# Patient Record
Sex: Female | Born: 1940
Health system: Southern US, Community
[De-identification: ages and names within clinical notes are randomized; demographics above are authoritative.]

## PROBLEM LIST (undated history)

## (undated) DIAGNOSIS — G4700 Insomnia, unspecified: Secondary | ICD-10-CM

## (undated) DIAGNOSIS — E010 Iodine-deficiency related diffuse (endemic) goiter: Secondary | ICD-10-CM

## (undated) DIAGNOSIS — F32A Depression, unspecified: Secondary | ICD-10-CM

## (undated) DIAGNOSIS — E039 Hypothyroidism, unspecified: Secondary | ICD-10-CM

## (undated) DIAGNOSIS — F419 Anxiety disorder, unspecified: Secondary | ICD-10-CM

## (undated) DIAGNOSIS — Z780 Asymptomatic menopausal state: Secondary | ICD-10-CM

## (undated) DIAGNOSIS — H6122 Impacted cerumen, left ear: Secondary | ICD-10-CM

## (undated) DIAGNOSIS — M810 Age-related osteoporosis without current pathological fracture: Secondary | ICD-10-CM

## (undated) DIAGNOSIS — F329 Major depressive disorder, single episode, unspecified: Secondary | ICD-10-CM

## (undated) DIAGNOSIS — I10 Essential (primary) hypertension: Secondary | ICD-10-CM

## (undated) DIAGNOSIS — E785 Hyperlipidemia, unspecified: Secondary | ICD-10-CM

## (undated) HISTORY — DX: Impacted cerumen, left ear: H61.22

## (undated) HISTORY — DX: Depression, unspecified: F32.A

## (undated) HISTORY — DX: Iodine-deficiency related diffuse (endemic) goiter: E01.0

## (undated) HISTORY — DX: Anxiety disorder, unspecified: F41.9

## (undated) HISTORY — DX: Major depressive disorder, single episode, unspecified: F32.9

## (undated) HISTORY — DX: Asymptomatic menopausal state: Z78.0

## (undated) HISTORY — DX: Hypothyroidism, unspecified: E03.9

## (undated) HISTORY — DX: Age-related osteoporosis without current pathological fracture: M81.0

## (undated) HISTORY — DX: Hyperlipidemia, unspecified: E78.5

## (undated) HISTORY — DX: Essential (primary) hypertension: I10

## (undated) HISTORY — PX: KNEE SURGERY: SHX244

## (undated) HISTORY — DX: Insomnia, unspecified: G47.00

---

## 1961-01-06 HISTORY — PX: OTHER SURGICAL HISTORY: SHX169

## 1966-01-06 HISTORY — PX: OTHER SURGICAL HISTORY: SHX169

## 1969-01-06 HISTORY — PX: OTHER SURGICAL HISTORY: SHX169

## 1975-01-07 HISTORY — PX: OTHER SURGICAL HISTORY: SHX169

## 1975-01-07 HISTORY — PX: TUBAL LIGATION: SHX77

## 1995-12-07 DIAGNOSIS — E039 Hypothyroidism, unspecified: Secondary | ICD-10-CM

## 1995-12-07 HISTORY — DX: Hypothyroidism, unspecified: E03.9

## 1997-01-06 DIAGNOSIS — Z780 Asymptomatic menopausal state: Secondary | ICD-10-CM

## 1997-01-06 HISTORY — DX: Asymptomatic menopausal state: Z78.0

## 1998-01-06 DIAGNOSIS — E010 Iodine-deficiency related diffuse (endemic) goiter: Secondary | ICD-10-CM

## 1998-01-06 HISTORY — DX: Iodine-deficiency related diffuse (endemic) goiter: E01.0

## 1998-06-27 ENCOUNTER — Other Ambulatory Visit: Admission: RE | Admit: 1998-06-27 | Discharge: 1998-06-27 | Payer: Self-pay | Admitting: Family Medicine

## 1999-09-04 ENCOUNTER — Other Ambulatory Visit: Admission: RE | Admit: 1999-09-04 | Discharge: 1999-09-04 | Payer: Self-pay | Admitting: Family Medicine

## 2000-09-09 ENCOUNTER — Other Ambulatory Visit: Admission: RE | Admit: 2000-09-09 | Discharge: 2000-09-09 | Payer: Self-pay | Admitting: Unknown Physician Specialty

## 2000-10-09 ENCOUNTER — Encounter: Payer: Self-pay | Admitting: Family Medicine

## 2000-10-09 ENCOUNTER — Encounter: Admission: RE | Admit: 2000-10-09 | Discharge: 2000-10-09 | Payer: Self-pay | Admitting: Family Medicine

## 2000-12-08 ENCOUNTER — Encounter: Payer: Self-pay | Admitting: Family Medicine

## 2000-12-08 ENCOUNTER — Encounter: Admission: RE | Admit: 2000-12-08 | Discharge: 2000-12-08 | Payer: Self-pay | Admitting: Family Medicine

## 2001-10-04 ENCOUNTER — Other Ambulatory Visit: Admission: RE | Admit: 2001-10-04 | Discharge: 2001-10-04 | Payer: Self-pay | Admitting: Family Medicine

## 2002-10-24 ENCOUNTER — Other Ambulatory Visit: Admission: RE | Admit: 2002-10-24 | Discharge: 2002-10-24 | Payer: Self-pay | Admitting: Family Medicine

## 2002-12-05 ENCOUNTER — Encounter: Admission: RE | Admit: 2002-12-05 | Discharge: 2002-12-05 | Payer: Self-pay | Admitting: Family Medicine

## 2003-11-14 ENCOUNTER — Other Ambulatory Visit: Admission: RE | Admit: 2003-11-14 | Discharge: 2003-11-14 | Payer: Self-pay | Admitting: Family Medicine

## 2003-12-18 ENCOUNTER — Encounter: Admission: RE | Admit: 2003-12-18 | Discharge: 2003-12-18 | Payer: Self-pay | Admitting: Family Medicine

## 2004-12-20 ENCOUNTER — Other Ambulatory Visit: Admission: RE | Admit: 2004-12-20 | Discharge: 2004-12-20 | Payer: Self-pay | Admitting: Family Medicine

## 2006-02-20 ENCOUNTER — Other Ambulatory Visit: Admission: RE | Admit: 2006-02-20 | Discharge: 2006-02-20 | Payer: Self-pay | Admitting: Family Medicine

## 2007-03-09 ENCOUNTER — Encounter: Admission: RE | Admit: 2007-03-09 | Discharge: 2007-03-09 | Payer: Self-pay | Admitting: Family Medicine

## 2008-03-30 ENCOUNTER — Encounter: Admission: RE | Admit: 2008-03-30 | Discharge: 2008-03-30 | Payer: Self-pay | Admitting: Family Medicine

## 2010-04-21 IMAGING — MG MM SCREEN MAMMOGRAM BILATERAL
1 series · 1 of 1 positions shown · non-contrast
Comparison: Prior studies.

DG SCREEN MAMMOGRAM BILATERAL
Bilateral CC and MLO view(s) were taken.

DIGITAL SCREENING MAMMOGRAM WITH CAD:

[R CC]
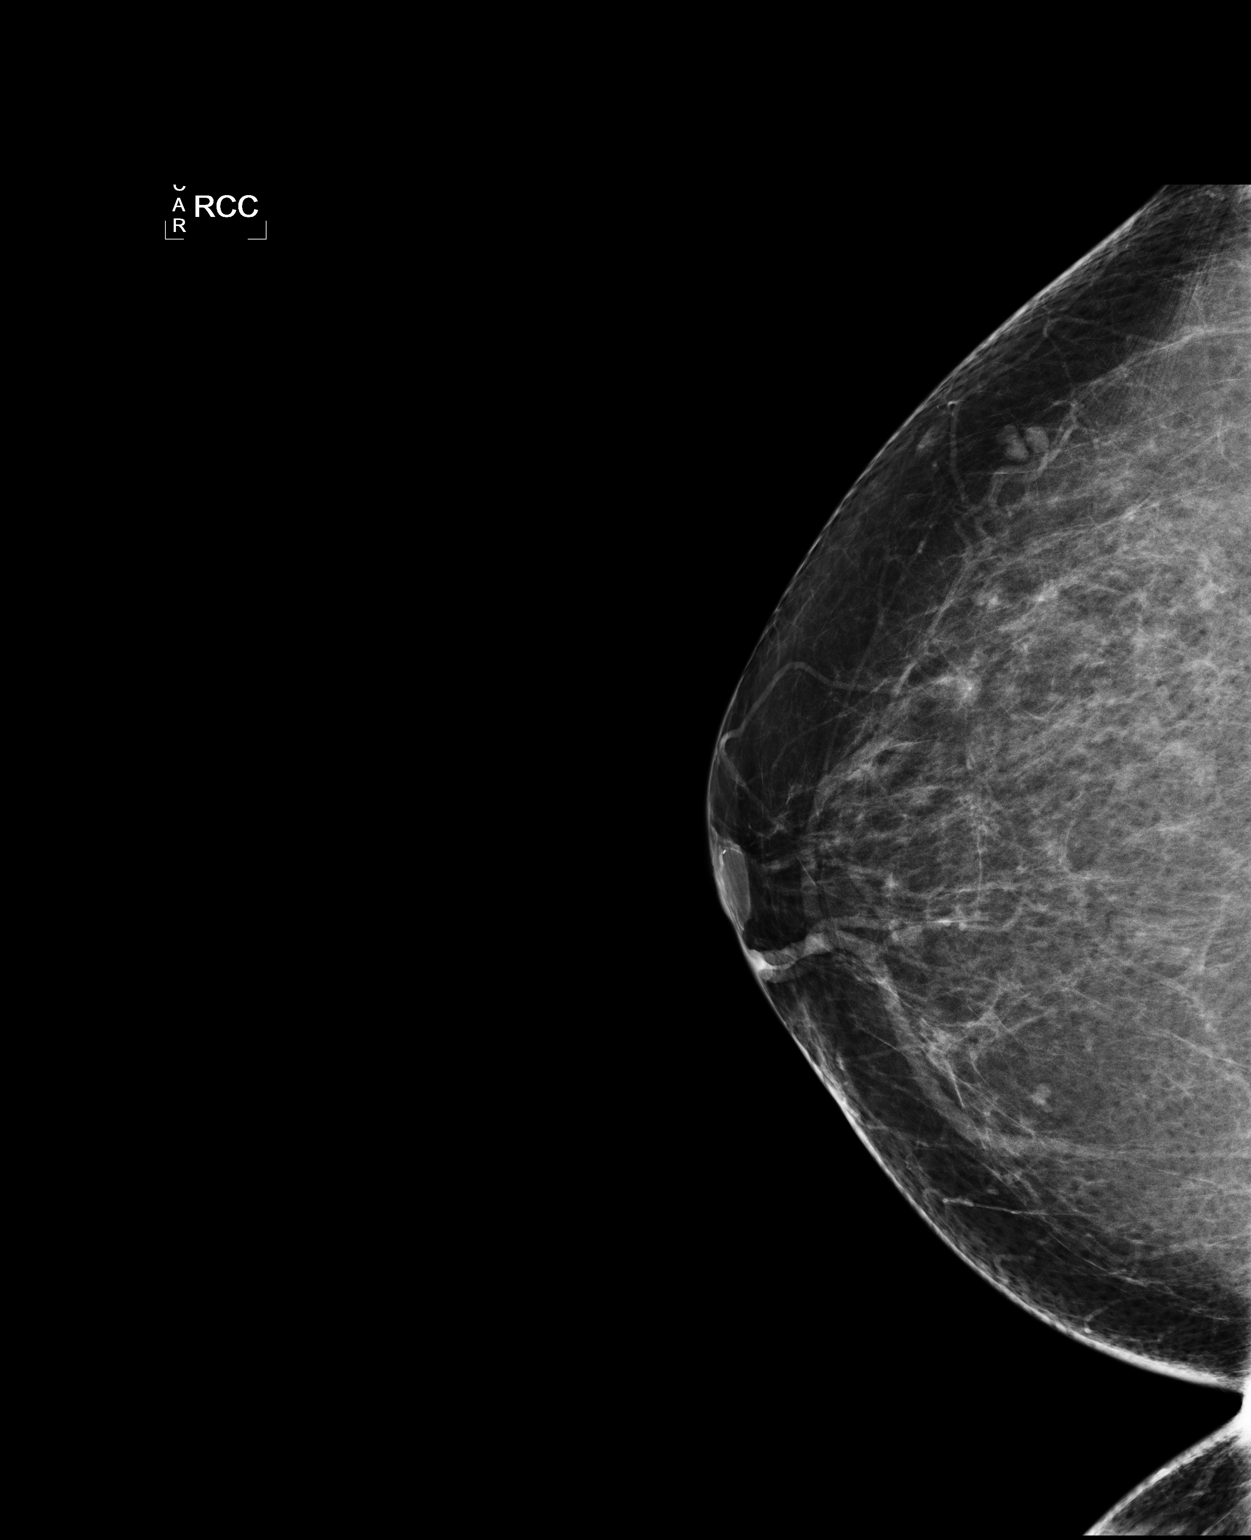

[1 of 1 positions shown; findings below may reference images not displayed]

There are scattered fibroglandular densities.  There is no dominant mass, architectural distortion 
or calcification to suggest malignancy.
IMPRESSION: No mammographic evidence of malignancy.  Suggest yearly screening mammography.

A result letter of this screening mammogram will be mailed directly to the patient.

ASSESSMENT: Negative - BI-RADS 1

Screening mammogram in 1 year.
ANALYZED BY COMPUTER AIDED DETECTION. , THIS PROCEDURE WAS A DIGITAL MAMMOGRAM.

## 2010-06-06 ENCOUNTER — Encounter: Payer: Self-pay | Admitting: Physician Assistant

## 2011-06-25 DIAGNOSIS — E039 Hypothyroidism, unspecified: Secondary | ICD-10-CM | POA: Diagnosis not present

## 2011-06-25 DIAGNOSIS — F411 Generalized anxiety disorder: Secondary | ICD-10-CM | POA: Diagnosis not present

## 2011-06-25 DIAGNOSIS — I10 Essential (primary) hypertension: Secondary | ICD-10-CM | POA: Diagnosis not present

## 2011-06-25 DIAGNOSIS — E785 Hyperlipidemia, unspecified: Secondary | ICD-10-CM | POA: Diagnosis not present

## 2011-06-25 DIAGNOSIS — R072 Precordial pain: Secondary | ICD-10-CM | POA: Diagnosis not present

## 2011-11-13 DIAGNOSIS — Z23 Encounter for immunization: Secondary | ICD-10-CM | POA: Diagnosis not present

## 2012-02-26 DIAGNOSIS — Z1231 Encounter for screening mammogram for malignant neoplasm of breast: Secondary | ICD-10-CM | POA: Diagnosis not present

## 2012-03-27 ENCOUNTER — Other Ambulatory Visit: Payer: Self-pay | Admitting: Nurse Practitioner

## 2012-04-04 ENCOUNTER — Other Ambulatory Visit: Payer: Self-pay | Admitting: Nurse Practitioner

## 2012-05-16 ENCOUNTER — Other Ambulatory Visit: Payer: Self-pay | Admitting: Nurse Practitioner

## 2012-05-17 NOTE — Telephone Encounter (Signed)
Patient last seen in office and had labs done on 06-25-11. Please advise

## 2012-05-17 NOTE — Telephone Encounter (Signed)
rx ready for pickup 

## 2012-05-18 NOTE — Telephone Encounter (Signed)
RX up front

## 2012-06-02 ENCOUNTER — Other Ambulatory Visit: Payer: Medicare Other

## 2012-06-02 ENCOUNTER — Other Ambulatory Visit: Payer: Self-pay | Admitting: Nurse Practitioner

## 2012-06-02 MED ORDER — SERTRALINE HCL 50 MG PO TABS: 50.0000 mg | ORAL_TABLET | Freq: Every day | ORAL | Status: DC

## 2012-06-02 MED ORDER — SYNTHROID 100 MCG PO TABS: 100.0000 ug | ORAL_TABLET | Freq: Every day | ORAL | Status: DC

## 2012-06-07 ENCOUNTER — Ambulatory Visit (INDEPENDENT_AMBULATORY_CARE_PROVIDER_SITE_OTHER): Payer: Medicare Other | Admitting: Nurse Practitioner

## 2012-06-07 ENCOUNTER — Encounter: Payer: Self-pay | Admitting: Nurse Practitioner

## 2012-06-07 VITALS — BP 144/74 | HR 76 | Temp 97.2°F | Ht 61.0 in | Wt 179.0 lb

## 2012-06-07 DIAGNOSIS — F329 Major depressive disorder, single episode, unspecified: Secondary | ICD-10-CM | POA: Diagnosis not present

## 2012-06-07 DIAGNOSIS — Z0000 Encounter for general adult medical examination without abnormal findings: Secondary | ICD-10-CM

## 2012-06-07 DIAGNOSIS — F32A Depression, unspecified: Secondary | ICD-10-CM

## 2012-06-07 DIAGNOSIS — M810 Age-related osteoporosis without current pathological fracture: Secondary | ICD-10-CM | POA: Insufficient documentation

## 2012-06-07 DIAGNOSIS — F411 Generalized anxiety disorder: Secondary | ICD-10-CM | POA: Insufficient documentation

## 2012-06-07 DIAGNOSIS — E785 Hyperlipidemia, unspecified: Secondary | ICD-10-CM | POA: Insufficient documentation

## 2012-06-07 DIAGNOSIS — E039 Hypothyroidism, unspecified: Secondary | ICD-10-CM | POA: Insufficient documentation

## 2012-06-07 LAB — COMPLETE METABOLIC PANEL WITH GFR
ALT: 29 U/L (ref 0–35)
Albumin: 4.6 g/dL (ref 3.5–5.2)
CO2: 30 mEq/L (ref 19–32)
Calcium: 10.3 mg/dL (ref 8.4–10.5)
Chloride: 104 mEq/L (ref 96–112)
GFR, Est African American: 89 mL/min
Sodium: 144 mEq/L (ref 135–145)
Total Protein: 7.4 g/dL (ref 6.0–8.3)

## 2012-06-07 LAB — THYROID PANEL WITH TSH
T4, Total: 8.2 ug/dL (ref 5.0–12.5)
TSH: 5.854 u[IU]/mL — ABNORMAL HIGH (ref 0.350–4.500)

## 2012-06-07 MED ORDER — SERTRALINE HCL 50 MG PO TABS: 50.0000 mg | ORAL_TABLET | Freq: Every day | ORAL | Status: DC

## 2012-06-07 NOTE — Patient Instructions (Signed)

## 2012-06-07 NOTE — Progress Notes (Addendum)
Subjective:    Patient ID: Sheila Ferrell, female    DOB: November 30, 1940, 72 y.o.   MRN: [id number] Patient here today for CPE- Hasn't had one in several years: Hyperlipidemia This is a chronic problem. The current episode started more than 1 year ago. The problem is controlled. Recent lipid tests were reviewed and are high. There are no known factors aggravating her hyperlipidemia. Pertinent negatives include no focal sensory loss, leg pain or myalgias. Current antihyperlipidemic treatment includes statins. The current treatment provides moderate improvement of lipids. Compliance problems include adherence to diet and adherence to exercise.   Thyroid Problem Presents for follow-up visit. Patient reports no anxiety, cold intolerance, depressed mood, diarrhea, dry skin, fatigue, hoarse voice, leg swelling, menstrual problem, nail problem, tremors or visual change. The symptoms have been stable. Her past medical history is significant for hyperlipidemia.  GAD Takes ativan about every other day- Keeps her from getting anxious- No side effects Depression Zoloft keeps her from getting upset- Husband has dementia and sh eis his sole caregiver. Osteorporosis Fosamax every Monday- no C/o side effects   Review of Systems  Constitutional: Negative for fatigue.  HENT: Negative for hoarse voice.   Gastrointestinal: Negative for diarrhea.  Endocrine: Negative for cold intolerance.  Genitourinary: Negative for menstrual problem.  Musculoskeletal: Negative for myalgias.  Neurological: Negative for tremors.  All other systems reviewed and are negative.       Objective:   Physical Exam  Constitutional: She is oriented to person, place, and time. She appears well-developed and well-nourished.  HENT:  Nose: Nose normal.  Mouth/Throat: Oropharynx is clear and moist.  Eyes: EOM are normal.  Neck: Trachea normal, normal range of motion and full passive range of motion without pain. Neck supple. No JVD  present. Carotid bruit is not present. No thyromegaly present.  Cardiovascular: Normal rate, regular rhythm, normal heart sounds and intact distal pulses.  Exam reveals no gallop and no friction rub.   No murmur heard. Pulmonary/Chest: Effort normal and breath sounds normal.  Abdominal: Soft. Bowel sounds are normal. She exhibits no distension and no mass. There is no tenderness.  Musculoskeletal: Normal range of motion.  Lymphadenopathy:    She has no cervical adenopathy.  Neurological: She is alert and oriented to person, place, and time. She has normal reflexes.  Skin: Skin is warm and dry.  Psychiatric: She has a normal mood and affect. Her behavior is normal. Judgment and thought content normal.    BP 144/74  Pulse 76  Temp(Src) 97.2 F (36.2 C) (Oral)  Ht 5\' 1"  (1.549 m)  Wt 179 lb (81.194 kg)  BMI 33.84 kg/m2       Assessment & Plan:  . 1. Annual physical exam   2. Hypothyroidism   3. GAD (generalized anxiety disorder)   4. Depression   5. Hyperlipidemia   6. Osteoporosis, unspecified    Orders Placed This Encounter  Procedures  . DG Bone Density    Standing Status: Future     Number of Occurrences:      Standing Expiration Date: 08/07/2013    Order Specific Question:  Reason for Exam (SYMPTOM  OR DIAGNOSIS REQUIRED)    Answer:  osteoporosis    Order Specific Question:  Preferred imaging location?    Answer:  Internal  . COMPLETE METABOLIC PANEL WITH GFR  . NMR Lipoprofile with Lipids  . Thyroid Panel With TSH   Current Outpatient Prescriptions on File Prior to Visit  Medication Sig Dispense Refill  .  alendronate (FOSAMAX) 70 MG tablet TAKE 1 TABLET EVERY WEEK  4 tablet  0  . LORazepam (ATIVAN) 0.5 MG tablet Take 0.5 mg by mouth 2 (two) times daily as needed.         No current facility-administered medications on file prior to visit.   Continue all meds  Labs pending Diet and exercise encouraged  Mary-Margaret Daphine Deutscher, FNP

## 2012-06-08 ENCOUNTER — Other Ambulatory Visit: Payer: Self-pay | Admitting: Nurse Practitioner

## 2012-06-08 LAB — NMR LIPOPROFILE WITH LIPIDS
HDL Particle Number: 33.5 umol/L (ref >=30.5)
LDL Size: 20.3 nm — ABNORMAL LOW (ref >20.5)
Large HDL-P: 5.4 umol/L (ref >=4.8)
Large VLDL-P: 5.1 nmol/L — ABNORMAL HIGH (ref <=2.7)
Small LDL Particle Number: 438 nmol/L (ref <=527)

## 2012-06-08 MED ORDER — SYNTHROID 112 MCG PO TABS: 112.0000 ug | ORAL_TABLET | Freq: Every day | ORAL | Status: DC

## 2012-06-08 NOTE — Progress Notes (Signed)
Change in meds

## 2012-06-09 NOTE — Addendum Note (Signed)
Addended by: Bennie Pierini on: 06/09/2012 11:37 AM   Modules accepted: Level of Service

## 2012-06-22 ENCOUNTER — Telehealth: Payer: Self-pay | Admitting: Nurse Practitioner

## 2012-06-22 MED ORDER — ALENDRONATE SODIUM 70 MG PO TABS: 70.0000 mg | ORAL_TABLET | ORAL | Status: DC

## 2012-06-22 MED ORDER — ATORVASTATIN CALCIUM 20 MG PO TABS: 20.0000 mg | ORAL_TABLET | Freq: Every day | ORAL | Status: DC

## 2012-06-22 NOTE — Telephone Encounter (Signed)
RX SENT IN.  

## 2012-06-22 NOTE — Telephone Encounter (Signed)
Patient notified

## 2012-07-03 ENCOUNTER — Other Ambulatory Visit: Payer: Self-pay | Admitting: Nurse Practitioner

## 2012-07-07 ENCOUNTER — Other Ambulatory Visit: Payer: Self-pay | Admitting: Nurse Practitioner

## 2012-07-30 ENCOUNTER — Other Ambulatory Visit: Payer: Self-pay | Admitting: *Deleted

## 2012-07-30 MED ORDER — ATORVASTATIN CALCIUM 20 MG PO TABS: 20.0000 mg | ORAL_TABLET | Freq: Every day | ORAL | Status: DC

## 2012-07-30 MED ORDER — SYNTHROID 112 MCG PO TABS: 112.0000 ug | ORAL_TABLET | Freq: Every day | ORAL | Status: DC

## 2012-07-30 MED ORDER — SERTRALINE HCL 50 MG PO TABS: ORAL_TABLET | ORAL | Status: DC

## 2012-07-30 MED ORDER — ALENDRONATE SODIUM 70 MG PO TABS: 70.0000 mg | ORAL_TABLET | ORAL | Status: DC

## 2012-08-04 ENCOUNTER — Encounter: Payer: Self-pay | Admitting: Pharmacist

## 2012-08-04 ENCOUNTER — Ambulatory Visit (INDEPENDENT_AMBULATORY_CARE_PROVIDER_SITE_OTHER): Payer: Medicare Other

## 2012-08-04 ENCOUNTER — Ambulatory Visit (INDEPENDENT_AMBULATORY_CARE_PROVIDER_SITE_OTHER): Payer: Medicare Other | Admitting: Pharmacist

## 2012-08-04 VITALS — BP 132/70 | HR 84 | Ht 61.0 in | Wt 180.0 lb

## 2012-08-04 DIAGNOSIS — E785 Hyperlipidemia, unspecified: Secondary | ICD-10-CM | POA: Diagnosis not present

## 2012-08-04 DIAGNOSIS — M810 Age-related osteoporosis without current pathological fracture: Secondary | ICD-10-CM

## 2012-08-04 NOTE — Patient Instructions (Signed)

## 2012-08-04 NOTE — Progress Notes (Signed)
Patient ID: Sheila Ferrell, female   DOB: Aug 11, 1940, 72 y.o.   MRN: [id number] [facility] Current Height: Height: 5\' 1"  (154.9 cm)      Max Lifetime Height:  5\' 2"  Current Weight: Weight: 180 lb (81.647 kg)       Ethnicity:Caucasian  BP: BP: 132/70 mmHg     HR:  Pulse Rate: 84      HPI: Does pt already have a diagnosis of:   Osteoporosis?  Yes  Back Pain?  No       Kyphosis?  No Prior fracture?  No Med(s) for Osteoporosis/Osteopenia:  Alendronate 70mg  weekly - for 9 years Med(s) previously tried for Osteoporosis/Osteopenia:  none                                                             PMH: Age at menopause:  Mid 37's  Hysterectomy?  No Oophorectomy?  No HRT? Yes - Former.  Type/duration: estrogen Steroid Use?  No Thyroid med?  Yes History of cancer?  No History of digestive disorders (ie Crohn's)?  No Current or previous eating disorders?  No Last Vitamin D Result:  43 (06/2010) Last GFR Result:  77 (06/2012)   FH/SH: Family history of osteoporosis?  No Parent with history of hip fracture?  Yes -mother Family history of breast cancer?  No Exercise?  No Smoking?  No Alcohol?  No    Calcium Assessment Calcium Intake  # of servings/day  Calcium mg  Milk (8 oz) 0  x  300  = 0  Yogurt (4 oz) 1 x  200 = 200mg   Cheese (1 oz) 1 x  200 = 200mg   Other Calcium sources   250mg   Ca supplement 0 = 0   Estimated calcium intake per day 650mg     DEXA Results Date of Test T-Score for AP Spine L1-L4 T-Score for Total Left Hip Neck of left hip T-Score for Total Right Hip  08/04/2012 -0.8 -1.0 -2.1 -0.9  08/22/2009 -0.5 -1.0 -1.8 -1.0  11/27/2003 -1.7 -1.5 -2.4 --  10/11/2001 -1.7 -1.8 -2.5 --   Assessment: Osteoporosis with slight decreases in BMD since last check H/o of vitamin D deficiency - currently corrected with vitamin D supplementation.   Hypertriglyceridemia - Tg elevated  Recommendations: 1.  Continue alendronate (FOSAMAX) 70mg  1 tablet weekly -  patient was taking alendronate with synthroid.  Advised to separate all medications from alendronate by at least 30 minutes. 2.  recommend calcium 1200mg  daily through supplementation or diet.  3.  recommend weight bearing exercise - 30 minutes at least 4 days per week.  - will help with osteoporosis and elevated Tg 4.  Counseled and educated about fall risk and prevention 5.  Discussed foods that increased Tg - patient to limit high sugar and CHO foods.  6. Vitamin D level ordered with next labs.  Recheck DEXA:  2 years  Time spent counseling patient:  30 minutes

## 2012-09-10 ENCOUNTER — Other Ambulatory Visit: Payer: Self-pay | Admitting: Nurse Practitioner

## 2012-10-06 ENCOUNTER — Ambulatory Visit (INDEPENDENT_AMBULATORY_CARE_PROVIDER_SITE_OTHER): Payer: Medicare Other | Admitting: Nurse Practitioner

## 2012-10-06 ENCOUNTER — Encounter: Payer: Self-pay | Admitting: Nurse Practitioner

## 2012-10-06 VITALS — BP 123/63 | HR 85 | Temp 98.4°F | Ht 61.0 in | Wt 182.0 lb

## 2012-10-06 DIAGNOSIS — E785 Hyperlipidemia, unspecified: Secondary | ICD-10-CM

## 2012-10-06 DIAGNOSIS — E559 Vitamin D deficiency, unspecified: Secondary | ICD-10-CM | POA: Diagnosis not present

## 2012-10-06 DIAGNOSIS — F329 Major depressive disorder, single episode, unspecified: Secondary | ICD-10-CM

## 2012-10-06 DIAGNOSIS — F411 Generalized anxiety disorder: Secondary | ICD-10-CM

## 2012-10-06 DIAGNOSIS — E039 Hypothyroidism, unspecified: Secondary | ICD-10-CM | POA: Diagnosis not present

## 2012-10-06 DIAGNOSIS — D239 Other benign neoplasm of skin, unspecified: Secondary | ICD-10-CM

## 2012-10-06 DIAGNOSIS — D229 Melanocytic nevi, unspecified: Secondary | ICD-10-CM

## 2012-10-06 DIAGNOSIS — F32A Depression, unspecified: Secondary | ICD-10-CM

## 2012-10-06 MED ORDER — LORAZEPAM 0.5 MG PO TABS: 0.5000 mg | ORAL_TABLET | Freq: Two times a day (BID) | ORAL | Status: DC | PRN

## 2012-10-06 MED ORDER — ATORVASTATIN CALCIUM 20 MG PO TABS: 20.0000 mg | ORAL_TABLET | Freq: Every day | ORAL | Status: DC

## 2012-10-06 MED ORDER — SYNTHROID 112 MCG PO TABS: 112.0000 ug | ORAL_TABLET | Freq: Every day | ORAL | Status: DC

## 2012-10-06 MED ORDER — SERTRALINE HCL 50 MG PO TABS: ORAL_TABLET | ORAL | Status: DC

## 2012-10-06 NOTE — Patient Instructions (Signed)

## 2012-10-06 NOTE — Progress Notes (Signed)
Subjective:    Patient ID: Sheila Ferrell, female    DOB: Dec 10, 1940, 72 y.o.   MRN: [id number]  Hyperlipidemia This is a chronic problem. The current episode started more than 1 year ago. The problem is controlled. Recent lipid tests were reviewed and are high. There are no known factors aggravating her hyperlipidemia. Pertinent negatives include no focal sensory loss, leg pain or myalgias. Current antihyperlipidemic treatment includes statins. The current treatment provides moderate improvement of lipids. Compliance problems include adherence to diet and adherence to exercise.   Thyroid Problem Presents for follow-up visit. Patient reports no anxiety, cold intolerance, depressed mood, diarrhea, dry skin, fatigue, hoarse voice, leg swelling, menstrual problem, nail problem, tremors or visual change. The symptoms have been stable. Her past medical history is significant for hyperlipidemia.  GAD Takes ativan about every other day- Keeps her from getting anxious- No side effects Depression Zoloft keeps her from getting upset- Husband has dementia and sh eis his sole caregiver. Osteorporosis Fosamax every Monday- no C/o side effects MOLES Multiple- patient wants to see dermatologist  Review of Systems  Constitutional: Negative for fatigue.  HENT: Negative for hoarse voice.   Gastrointestinal: Negative for diarrhea.  Endocrine: Negative for cold intolerance.  Genitourinary: Negative for menstrual problem.  Musculoskeletal: Negative for myalgias.  Neurological: Negative for tremors.  All other systems reviewed and are negative.       Objective:   Physical Exam  Constitutional: She is oriented to person, place, and time. She appears well-developed and well-nourished.  HENT:  Nose: Nose normal.  Mouth/Throat: Oropharynx is clear and moist.  Eyes: EOM are normal.  Neck: Trachea normal, normal range of motion and full passive range of motion without pain. Neck supple. No JVD present.  Carotid bruit is not present. No thyromegaly present.  Cardiovascular: Normal rate, regular rhythm, normal heart sounds and intact distal pulses.  Exam reveals no gallop and no friction rub.   No murmur heard. Pulmonary/Chest: Effort normal and breath sounds normal.  Abdominal: Soft. Bowel sounds are normal. She exhibits no distension and no mass. There is no tenderness.  Musculoskeletal: Normal range of motion.  Lymphadenopathy:    She has no cervical adenopathy.  Neurological: She is alert and oriented to person, place, and time. She has normal reflexes.  Skin: Skin is warm and dry.  Psychiatric: She has a normal mood and affect. Her behavior is normal. Judgment and thought content normal.    BP 123/63  Pulse 85  Temp(Src) 98.4 F (36.9 C) (Oral)  Ht 5\' 1"  (1.549 m)  Wt 182 lb (82.555 kg)  BMI 34.41 kg/m2       Assessment & Plan:   1. Hypothyroidism   2. Hyperlipidemia   3. GAD (generalized anxiety disorder)   4. Depression   5. Skin moles   6. Unspecified vitamin D deficiency   7. Vitamin D deficiency    Orders Placed This Encounter  Procedures  . CMP14+EGFR  . NMR, lipoprofile  . Thyroid Panel With TSH  . Vit D  25 hydroxy (rtn osteoporosis monitoring)  . Ambulatory referral to Dermatology    Referral Priority:  Routine    Referral Type:  Consultation    Referral Reason:  Specialty Services Required    Requested Specialty:  Dermatology    Number of Visits Requested:  1   Meds ordered this encounter  Medications  . sertraline (ZOLOFT) 50 MG tablet    Sig: TAKE ONE TABLET BY MOUTH ONE TIME DAILY  Dispense:  90 tablet    Refill:  1    Order Specific Question:  Supervising Provider    Answer:  Ernestina Penna [[id number]]  . atorvastatin (LIPITOR) 20 MG tablet    Sig: Take 1 tablet (20 mg total) by mouth daily.    Dispense:  90 tablet    Refill:  1    Order Specific Question:  Supervising Provider    Answer:  Ernestina Penna [[id number]]  . SYNTHROID 112 MCG tablet     Sig: Take 1 tablet (112 mcg total) by mouth daily before breakfast.    Dispense:  90 tablet    Refill:  1    Order Specific Question:  Supervising Provider    Answer:  Ernestina Penna [[id number]]  . LORazepam (ATIVAN) 0.5 MG tablet    Sig: Take 1 tablet (0.5 mg total) by mouth 2 (two) times daily as needed.    Dispense:  30 tablet    Refill:  2    Order Specific Question:  Supervising Provider    Answer:  Deborra Medina    Continue all meds Labs pending Diet and exercise encouraged Health maintenance reviewed Follow up in 3 months  Mary-Margaret Daphine Deutscher, FNP

## 2012-10-07 LAB — NMR, LIPOPROFILE
Cholesterol: 135 mg/dL (ref <200)
LDL Particle Number: 841 nmol/L (ref <1000)
LDLC SERPL CALC-MCNC: 56 mg/dL (ref <100)
LP-IR Score: 79 — ABNORMAL HIGH (ref <=45)
Triglycerides by NMR: 164 mg/dL — ABNORMAL HIGH (ref <150)

## 2012-10-07 LAB — THYROID PANEL WITH TSH
Free Thyroxine Index: 2.6 (ref 1.2–4.9)
T3 Uptake Ratio: 30 % (ref 24–39)
T4, Total: 8.5 ug/dL (ref 4.5–12.0)

## 2012-10-07 LAB — CMP14+EGFR
Albumin: 4.6 g/dL (ref 3.5–4.8)
Alkaline Phosphatase: 113 IU/L (ref 39–117)
BUN: 7 mg/dL — ABNORMAL LOW (ref 8–27)
CO2: 30 mmol/L — ABNORMAL HIGH (ref 18–29)
Chloride: 100 mmol/L (ref 97–108)
Glucose: 89 mg/dL (ref 65–99)
Total Protein: 6.9 g/dL (ref 6.0–8.5)

## 2012-10-20 ENCOUNTER — Telehealth: Payer: Self-pay | Admitting: Nurse Practitioner

## 2012-10-21 NOTE — Telephone Encounter (Signed)
It is ativan rx that is here to be picked up

## 2012-10-21 NOTE — Telephone Encounter (Signed)
Labs results are all normal

## 2012-10-22 NOTE — Telephone Encounter (Signed)
PAtient  Aware

## 2012-10-22 NOTE — Telephone Encounter (Signed)
Patient wants her derm referral in [place] not [place]

## 2012-10-22 NOTE — Telephone Encounter (Signed)
Patient aware.

## 2012-11-02 DIAGNOSIS — Z23 Encounter for immunization: Secondary | ICD-10-CM | POA: Diagnosis not present

## 2012-12-06 ENCOUNTER — Other Ambulatory Visit: Payer: Self-pay | Admitting: Nurse Practitioner

## 2012-12-12 ENCOUNTER — Other Ambulatory Visit: Payer: Self-pay | Admitting: Nurse Practitioner

## 2012-12-20 ENCOUNTER — Other Ambulatory Visit: Payer: Self-pay | Admitting: Nurse Practitioner

## 2012-12-27 ENCOUNTER — Other Ambulatory Visit: Payer: Self-pay | Admitting: Nurse Practitioner

## 2012-12-28 NOTE — Telephone Encounter (Signed)
Last seen 10/06/12  MMM  Pharmacy requesting 90 day supply

## 2013-01-10 ENCOUNTER — Ambulatory Visit (INDEPENDENT_AMBULATORY_CARE_PROVIDER_SITE_OTHER): Payer: Medicare Other | Admitting: Nurse Practitioner

## 2013-01-10 ENCOUNTER — Encounter: Payer: Self-pay | Admitting: Nurse Practitioner

## 2013-01-10 VITALS — BP 136/87 | HR 80 | Temp 96.3°F | Ht 61.0 in | Wt 182.0 lb

## 2013-01-10 DIAGNOSIS — F329 Major depressive disorder, single episode, unspecified: Secondary | ICD-10-CM

## 2013-01-10 DIAGNOSIS — E559 Vitamin D deficiency, unspecified: Secondary | ICD-10-CM

## 2013-01-10 DIAGNOSIS — E039 Hypothyroidism, unspecified: Secondary | ICD-10-CM

## 2013-01-10 DIAGNOSIS — Z23 Encounter for immunization: Secondary | ICD-10-CM | POA: Diagnosis not present

## 2013-01-10 DIAGNOSIS — E785 Hyperlipidemia, unspecified: Secondary | ICD-10-CM | POA: Diagnosis not present

## 2013-01-10 DIAGNOSIS — F411 Generalized anxiety disorder: Secondary | ICD-10-CM

## 2013-01-10 DIAGNOSIS — L989 Disorder of the skin and subcutaneous tissue, unspecified: Secondary | ICD-10-CM

## 2013-01-10 DIAGNOSIS — F32A Depression, unspecified: Secondary | ICD-10-CM

## 2013-01-10 DIAGNOSIS — F3289 Other specified depressive episodes: Secondary | ICD-10-CM

## 2013-01-10 MED ORDER — ALENDRONATE SODIUM 70 MG PO TABS: 70.0000 mg | ORAL_TABLET | ORAL | Status: DC

## 2013-01-10 MED ORDER — SERTRALINE HCL 50 MG PO TABS: ORAL_TABLET | ORAL | Status: DC

## 2013-01-10 MED ORDER — ATORVASTATIN CALCIUM 20 MG PO TABS: 20.0000 mg | ORAL_TABLET | Freq: Every day | ORAL | Status: DC

## 2013-01-10 MED ORDER — LORAZEPAM 0.5 MG PO TABS: 0.5000 mg | ORAL_TABLET | Freq: Two times a day (BID) | ORAL | Status: DC | PRN

## 2013-01-10 NOTE — Progress Notes (Signed)
Subjective:    Patient ID: Sheila Ferrell, female    DOB: November 17, 1940, 73 y.o.   MRN: [id number]  Patient here today fro follow up- no changes since last visit.  Hyperlipidemia This is a chronic problem. The current episode started more than 1 year ago. The problem is controlled. Recent lipid tests were reviewed and are high. There are no known factors aggravating her hyperlipidemia. Pertinent negatives include no focal sensory loss, leg pain or myalgias. Current antihyperlipidemic treatment includes statins. The current treatment provides moderate improvement of lipids. Compliance problems include adherence to diet and adherence to exercise.   Thyroid Problem Presents for follow-up visit. Patient reports no anxiety, cold intolerance, depressed mood, diarrhea, dry skin, fatigue, hoarse voice, leg swelling, menstrual problem, nail problem, tremors or visual change. The symptoms have been stable. Her past medical history is significant for hyperlipidemia.  GAD Takes ativan about every other day- Keeps her from getting anxious- No side effects Depression Zoloft keeps her from getting upset- Husband has dementia and sh eis his sole caregiver. Osteorporosis Fosamax every Monday- no C/o side effects MOLES Multiple- patient wants to see dermatologist  Review of Systems  Constitutional: Negative for fatigue.  HENT: Negative for hoarse voice.   Gastrointestinal: Negative for diarrhea.  Endocrine: Negative for cold intolerance.  Genitourinary: Negative for menstrual problem.  Musculoskeletal: Negative for myalgias.  Neurological: Negative for tremors.  All other systems reviewed and are negative.       Objective:   Physical Exam  Constitutional: She is oriented to person, place, and time. She appears well-developed and well-nourished.  HENT:  Nose: Nose normal.  Mouth/Throat: Oropharynx is clear and moist.  Eyes: EOM are normal.  Neck: Trachea normal, normal range of motion and full  passive range of motion without pain. Neck supple. No JVD present. Carotid bruit is not present. No thyromegaly present.  Cardiovascular: Normal rate, regular rhythm, normal heart sounds and intact distal pulses.  Exam reveals no gallop and no friction rub.   No murmur heard. Pulmonary/Chest: Effort normal and breath sounds normal.  Abdominal: Soft. Bowel sounds are normal. She exhibits no distension and no mass. There is no tenderness.  Musculoskeletal: Normal range of motion.  Lymphadenopathy:    She has no cervical adenopathy.  Neurological: She is alert and oriented to person, place, and time. She has normal reflexes.  Skin: Skin is warm and dry.  Psychiatric: She has a normal mood and affect. Her behavior is normal. Judgment and thought content normal.    BP 136/87  Pulse 80  Temp(Src) 96.3 F (35.7 C) (Oral)  Ht '5\' 1"'  (1.549 m)  Wt 182 lb (82.555 kg)  BMI 34.41 kg/m2       Assessment & Plan:   1. Vitamin D deficiency   2. Hypothyroidism   3. Hyperlipidemia   4. GAD (generalized anxiety disorder)   5. Depression   6. Benign skin lesion of multiple sites    Orders Placed This Encounter  Procedures  . CMP14+EGFR  . NMR, lipoprofile  . Thyroid Panel With TSH  . Ambulatory referral to Dermatology    Referral Priority:  Routine    Referral Type:  Consultation    Referral Reason:  Specialty Services Required    Referred to Provider:  Jerene Canny, MD    Requested Specialty:  Dermatology    Number of Visits Requested:  1   Meds ordered this encounter  Medications  . alendronate (FOSAMAX) 70 MG tablet    Sig:  Take 1 tablet (70 mg total) by mouth once a week. Take with a full glass of water on an empty stomach.    Dispense:  12 tablet    Refill:  1    Order Specific Question:  Supervising Provider    Answer:  Chipper Herb [[id number]]  . atorvastatin (LIPITOR) 20 MG tablet    Sig: Take 1 tablet (20 mg total) by mouth daily.    Dispense:  90 tablet    Refill:  1     Order Specific Question:  Supervising Provider    Answer:  Chipper Herb [[id number]]  . LORazepam (ATIVAN) 0.5 MG tablet    Sig: Take 1 tablet (0.5 mg total) by mouth 2 (two) times daily as needed.    Dispense:  30 tablet    Refill:  2    Order Specific Question:  Supervising Provider    Answer:  Chipper Herb [[id number]]  . sertraline (ZOLOFT) 50 MG tablet    Sig: TAKE ONE TABLET BY MOUTH ONE TIME DAILY    Dispense:  90 tablet    Refill:  1    Order Specific Question:  Supervising Provider    Answer:  Joycelyn Man    Continue all meds Labs pending Diet and exercise encouraged Health maintenance reviewed Follow up in 3 mmonths   Mary-Margaret Hassell Done, FNP

## 2013-01-10 NOTE — Patient Instructions (Signed)

## 2013-01-11 LAB — CMP14+EGFR
ALBUMIN: 4.3 g/dL (ref 3.5–4.8)
ALK PHOS: 110 IU/L (ref 39–117)
ALT: 24 IU/L (ref 0–32)
AST: 20 IU/L (ref 0–40)
Albumin/Globulin Ratio: 1.8 (ref 1.1–2.5)
BUN / CREAT RATIO: 14 (ref 11–26)
BUN: 9 mg/dL (ref 8–27)
CHLORIDE: 101 mmol/L (ref 97–108)
CO2: 28 mmol/L (ref 18–29)
CREATININE: 0.66 mg/dL (ref 0.57–1.00)
Calcium: 9.9 mg/dL (ref 8.6–10.2)
GFR calc Af Amer: 102 mL/min/{1.73_m2} (ref >59)
GFR calc non Af Amer: 89 mL/min/{1.73_m2} (ref >59)
Globulin, Total: 2.4 g/dL (ref 1.5–4.5)
Glucose: 94 mg/dL (ref 65–99)
Potassium: 4.2 mmol/L (ref 3.5–5.2)
Sodium: 144 mmol/L (ref 134–144)
Total Bilirubin: 0.3 mg/dL (ref 0.0–1.2)
Total Protein: 6.7 g/dL (ref 6.0–8.5)

## 2013-01-11 LAB — THYROID PANEL WITH TSH
Free Thyroxine Index: 2.3 (ref 1.2–4.9)
T3 UPTAKE RATIO: 30 % (ref 24–39)
T4 TOTAL: 7.7 ug/dL (ref 4.5–12.0)
TSH: 4.56 u[IU]/mL — ABNORMAL HIGH (ref 0.450–4.500)

## 2013-01-11 LAB — NMR, LIPOPROFILE
Cholesterol: 111 mg/dL (ref <200)
HDL Cholesterol by NMR: 39 mg/dL — ABNORMAL LOW (ref >=40)
HDL Particle Number: 30.4 umol/L — ABNORMAL LOW (ref >=30.5)
LDL PARTICLE NUMBER: 761 nmol/L (ref <1000)
LDL SIZE: 20 nm — AB (ref >20.5)
LDLC SERPL CALC-MCNC: 46 mg/dL (ref <100)
LP-IR Score: 64 — ABNORMAL HIGH (ref <=45)
Small LDL Particle Number: 683 nmol/L — ABNORMAL HIGH (ref <=527)
TRIGLYCERIDES BY NMR: 131 mg/dL (ref <150)

## 2013-02-03 DIAGNOSIS — D1801 Hemangioma of skin and subcutaneous tissue: Secondary | ICD-10-CM | POA: Diagnosis not present

## 2013-02-03 DIAGNOSIS — L821 Other seborrheic keratosis: Secondary | ICD-10-CM | POA: Diagnosis not present

## 2013-03-02 DIAGNOSIS — H5200 Hypermetropia, unspecified eye: Secondary | ICD-10-CM | POA: Diagnosis not present

## 2013-03-02 DIAGNOSIS — H52229 Regular astigmatism, unspecified eye: Secondary | ICD-10-CM | POA: Diagnosis not present

## 2013-03-02 DIAGNOSIS — H524 Presbyopia: Secondary | ICD-10-CM | POA: Diagnosis not present

## 2013-03-02 DIAGNOSIS — H2510 Age-related nuclear cataract, unspecified eye: Secondary | ICD-10-CM | POA: Diagnosis not present

## 2013-04-11 ENCOUNTER — Ambulatory Visit (INDEPENDENT_AMBULATORY_CARE_PROVIDER_SITE_OTHER): Payer: Medicare Other | Admitting: Nurse Practitioner

## 2013-04-11 ENCOUNTER — Encounter: Payer: Self-pay | Admitting: Nurse Practitioner

## 2013-04-11 VITALS — BP 140/81 | HR 89 | Temp 97.3°F | Ht 61.0 in | Wt 180.0 lb

## 2013-04-11 DIAGNOSIS — F329 Major depressive disorder, single episode, unspecified: Secondary | ICD-10-CM

## 2013-04-11 DIAGNOSIS — E559 Vitamin D deficiency, unspecified: Secondary | ICD-10-CM

## 2013-04-11 DIAGNOSIS — M79609 Pain in unspecified limb: Secondary | ICD-10-CM

## 2013-04-11 DIAGNOSIS — M79601 Pain in right arm: Secondary | ICD-10-CM

## 2013-04-11 DIAGNOSIS — F3289 Other specified depressive episodes: Secondary | ICD-10-CM

## 2013-04-11 DIAGNOSIS — E039 Hypothyroidism, unspecified: Secondary | ICD-10-CM | POA: Diagnosis not present

## 2013-04-11 DIAGNOSIS — F32A Depression, unspecified: Secondary | ICD-10-CM

## 2013-04-11 DIAGNOSIS — M810 Age-related osteoporosis without current pathological fracture: Secondary | ICD-10-CM

## 2013-04-11 DIAGNOSIS — F411 Generalized anxiety disorder: Secondary | ICD-10-CM

## 2013-04-11 DIAGNOSIS — Z23 Encounter for immunization: Secondary | ICD-10-CM | POA: Diagnosis not present

## 2013-04-11 DIAGNOSIS — E785 Hyperlipidemia, unspecified: Secondary | ICD-10-CM

## 2013-04-11 DIAGNOSIS — Z713 Dietary counseling and surveillance: Secondary | ICD-10-CM

## 2013-04-11 MED ORDER — SERTRALINE HCL 50 MG PO TABS: ORAL_TABLET | ORAL | Status: DC

## 2013-04-11 MED ORDER — SYNTHROID 112 MCG PO TABS: 112.0000 ug | ORAL_TABLET | Freq: Every day | ORAL | Status: DC

## 2013-04-11 MED ORDER — ATORVASTATIN CALCIUM 20 MG PO TABS: 20.0000 mg | ORAL_TABLET | Freq: Every day | ORAL | Status: DC

## 2013-04-11 MED ORDER — ALENDRONATE SODIUM 70 MG PO TABS: 70.0000 mg | ORAL_TABLET | ORAL | Status: DC

## 2013-04-11 MED ORDER — LORAZEPAM 0.5 MG PO TABS: 0.5000 mg | ORAL_TABLET | Freq: Two times a day (BID) | ORAL | Status: DC | PRN

## 2013-04-11 NOTE — Patient Instructions (Signed)

## 2013-04-11 NOTE — Progress Notes (Signed)
Subjective:    Patient ID: Sheila Ferrell, female    DOB: 16-Feb-1940, 73 y.o.   MRN: [id number]  Patient here today fro follow up- no changes since last visit.  Hyperlipidemia This is a chronic problem. The current episode started more than 1 year ago. The problem is controlled. Recent lipid tests were reviewed and are high. There are no known factors aggravating her hyperlipidemia. Pertinent negatives include no focal sensory loss, leg pain or myalgias. Current antihyperlipidemic treatment includes statins. The current treatment provides moderate improvement of lipids. Compliance problems include adherence to diet and adherence to exercise.   Thyroid Problem Presents for follow-up visit. Patient reports no anxiety, cold intolerance, depressed mood, diarrhea, dry skin, fatigue, hoarse voice, leg swelling, menstrual problem, nail problem, tremors or visual change. The symptoms have been stable. Her past medical history is significant for hyperlipidemia.  GAD Takes ativan about every other day- Keeps her from getting anxious- No side effects Depression Zoloft keeps her from getting upset- Husband has dementia and sh eis his sole caregiver. Osteorporosis Fosamax every Monday- no C/o side effects  * C/o right arm pain with certain movement - not daily-   Review of Systems  Constitutional: Negative for fatigue.  HENT: Negative for hoarse voice.   Gastrointestinal: Negative for diarrhea.  Endocrine: Negative for cold intolerance.  Genitourinary: Negative for menstrual problem.  Musculoskeletal: Negative for myalgias.  Neurological: Negative for tremors.  All other systems reviewed and are negative.       Objective:   Physical Exam  Constitutional: She is oriented to person, place, and time. She appears well-developed and well-nourished.  HENT:  Nose: Nose normal.  Mouth/Throat: Oropharynx is clear and moist.  Eyes: EOM are normal.  Neck: Trachea normal, normal range of motion and  full passive range of motion without pain. Neck supple. No JVD present. Carotid bruit is not present. No thyromegaly present.  Cardiovascular: Normal rate, regular rhythm, normal heart sounds and intact distal pulses.  Exam reveals no gallop and no friction rub.   No murmur heard. Pulmonary/Chest: Effort normal and breath sounds normal.  Abdominal: Soft. Bowel sounds are normal. She exhibits no distension and no mass. There is no tenderness.  Musculoskeletal: Normal range of motion.  FROM of right shoulder- no point tenderness- Grips equal bilaterally.  Lymphadenopathy:    She has no cervical adenopathy.  Neurological: She is alert and oriented to person, place, and time. She has normal reflexes.  Skin: Skin is warm and dry.  Psychiatric: She has a normal mood and affect. Her behavior is normal. Judgment and thought content normal.    BP 140/81  Pulse 89  Temp(Src) 97.3 F (36.3 C) (Oral)  Ht '5\' 1"'  (1.549 m)  Wt 180 lb (81.647 kg)  BMI 34.03 kg/m2       Assessment & Plan:    1. Vitamin D deficiency   2. Hypothyroidism   3. Hyperlipidemia   4. GAD (generalized anxiety disorder)   5. Depression   6. Weight loss counseling, encounter for   7. Osteoporosis   8. Right arm pain    Orders Placed This Encounter  Procedures  . CMP14+EGFR  . NMR, lipoprofile  . Thyroid Panel With TSH   Meds ordered this encounter  Medications  . alendronate (FOSAMAX) 70 MG tablet    Sig: Take 1 tablet (70 mg total) by mouth once a week. Take with a full glass of water on an empty stomach.    Dispense:  12 tablet  Refill:  1    Order Specific Question:  Supervising Provider    Answer:  Chipper Herb [[id number]]  . atorvastatin (LIPITOR) 20 MG tablet    Sig: Take 1 tablet (20 mg total) by mouth daily.    Dispense:  90 tablet    Refill:  1    Order Specific Question:  Supervising Provider    Answer:  Chipper Herb [[id number]]  . sertraline (ZOLOFT) 50 MG tablet    Sig: TAKE ONE TABLET BY  MOUTH ONE TIME DAILY    Dispense:  90 tablet    Refill:  1    Order Specific Question:  Supervising Provider    Answer:  Chipper Herb [[id number]]  . LORazepam (ATIVAN) 0.5 MG tablet    Sig: Take 1 tablet (0.5 mg total) by mouth 2 (two) times daily as needed.    Dispense:  30 tablet    Refill:  2    Order Specific Question:  Supervising Provider    Answer:  Chipper Herb [[id number]]  . SYNTHROID 112 MCG tablet    Sig: Take 1 tablet (112 mcg total) by mouth daily before breakfast.    Dispense:  90 tablet    Refill:  1    Order Specific Question:  Supervising Provider    Answer:  Chipper Herb [[id number]]   Patient to schedule mammogram and pap boostrix today Labs pending Health maintenance reviewed Diet and exercise encouraged- discussed weight loss Continue all meds Follow up  In 3 months   [place], FNP

## 2013-04-13 LAB — CMP14+EGFR
ALK PHOS: 108 IU/L (ref 39–117)
ALT: 21 IU/L (ref 0–32)
AST: 17 IU/L (ref 0–40)
Albumin/Globulin Ratio: 1.7 (ref 1.1–2.5)
Albumin: 4.5 g/dL (ref 3.5–4.8)
BUN / CREAT RATIO: 15 (ref 11–26)
BUN: 12 mg/dL (ref 8–27)
CHLORIDE: 100 mmol/L (ref 97–108)
CO2: 27 mmol/L (ref 18–29)
Calcium: 9.8 mg/dL (ref 8.7–10.3)
Creatinine, Ser: 0.82 mg/dL (ref 0.57–1.00)
GFR calc Af Amer: 83 mL/min/{1.73_m2} (ref >59)
GFR calc non Af Amer: 72 mL/min/{1.73_m2} (ref >59)
Globulin, Total: 2.6 g/dL (ref 1.5–4.5)
Glucose: 95 mg/dL (ref 65–99)
Potassium: 4.4 mmol/L (ref 3.5–5.2)
Sodium: 143 mmol/L (ref 134–144)
Total Bilirubin: 0.4 mg/dL (ref 0.0–1.2)
Total Protein: 7.1 g/dL (ref 6.0–8.5)

## 2013-04-13 LAB — NMR, LIPOPROFILE
Cholesterol: 130 mg/dL (ref <200)
HDL Cholesterol by NMR: 47 mg/dL (ref >=40)
HDL Particle Number: 36.1 umol/L (ref >=30.5)
LDL Particle Number: 846 nmol/L (ref <1000)
LDL Size: 20.9 nm (ref >20.5)
LDLC SERPL CALC-MCNC: 52 mg/dL (ref <100)
LP-IR Score: 71 — ABNORMAL HIGH (ref <=45)
Small LDL Particle Number: 422 nmol/L (ref <=527)
Triglycerides by NMR: 156 mg/dL — ABNORMAL HIGH (ref <150)

## 2013-04-13 LAB — THYROID PANEL WITH TSH
FREE THYROXINE INDEX: 2.9 (ref 1.2–4.9)
T3 Uptake Ratio: 31 % (ref 24–39)
T4, Total: 9.2 ug/dL (ref 4.5–12.0)
TSH: 0.651 u[IU]/mL (ref 0.450–4.500)

## 2013-05-23 DIAGNOSIS — H25813 Combined forms of age-related cataract, bilateral: Secondary | ICD-10-CM | POA: Insufficient documentation

## 2013-05-23 DIAGNOSIS — H2589 Other age-related cataract: Secondary | ICD-10-CM | POA: Diagnosis not present

## 2013-06-20 DIAGNOSIS — Z961 Presence of intraocular lens: Secondary | ICD-10-CM | POA: Insufficient documentation

## 2013-06-20 DIAGNOSIS — Z9849 Cataract extraction status, unspecified eye: Secondary | ICD-10-CM | POA: Insufficient documentation

## 2013-06-21 DIAGNOSIS — E039 Hypothyroidism, unspecified: Secondary | ICD-10-CM | POA: Diagnosis not present

## 2013-06-21 DIAGNOSIS — F430 Acute stress reaction: Secondary | ICD-10-CM | POA: Diagnosis not present

## 2013-06-21 DIAGNOSIS — IMO0002 Reserved for concepts with insufficient information to code with codable children: Secondary | ICD-10-CM | POA: Diagnosis not present

## 2013-06-21 DIAGNOSIS — E7800 Pure hypercholesterolemia, unspecified: Secondary | ICD-10-CM | POA: Diagnosis not present

## 2013-06-21 DIAGNOSIS — M810 Age-related osteoporosis without current pathological fracture: Secondary | ICD-10-CM | POA: Diagnosis not present

## 2013-06-21 DIAGNOSIS — Z9889 Other specified postprocedural states: Secondary | ICD-10-CM | POA: Diagnosis not present

## 2013-06-21 DIAGNOSIS — Z87891 Personal history of nicotine dependence: Secondary | ICD-10-CM | POA: Diagnosis not present

## 2013-06-21 DIAGNOSIS — H2589 Other age-related cataract: Secondary | ICD-10-CM | POA: Diagnosis not present

## 2013-06-22 DIAGNOSIS — Z961 Presence of intraocular lens: Secondary | ICD-10-CM | POA: Diagnosis not present

## 2013-06-22 DIAGNOSIS — Z48810 Encounter for surgical aftercare following surgery on the sense organs: Secondary | ICD-10-CM | POA: Diagnosis not present

## 2013-06-22 DIAGNOSIS — Z9849 Cataract extraction status, unspecified eye: Secondary | ICD-10-CM | POA: Diagnosis not present

## 2013-07-11 ENCOUNTER — Ambulatory Visit (INDEPENDENT_AMBULATORY_CARE_PROVIDER_SITE_OTHER): Payer: Medicare Other | Admitting: Nurse Practitioner

## 2013-07-11 ENCOUNTER — Encounter: Payer: Self-pay | Admitting: Nurse Practitioner

## 2013-07-11 VITALS — BP 132/80 | HR 93 | Temp 98.3°F | Ht 61.0 in | Wt 181.0 lb

## 2013-07-11 DIAGNOSIS — E038 Other specified hypothyroidism: Secondary | ICD-10-CM | POA: Diagnosis not present

## 2013-07-11 DIAGNOSIS — Z6834 Body mass index (BMI) 34.0-34.9, adult: Secondary | ICD-10-CM

## 2013-07-11 DIAGNOSIS — M810 Age-related osteoporosis without current pathological fracture: Secondary | ICD-10-CM

## 2013-07-11 DIAGNOSIS — F411 Generalized anxiety disorder: Secondary | ICD-10-CM

## 2013-07-11 DIAGNOSIS — E559 Vitamin D deficiency, unspecified: Secondary | ICD-10-CM | POA: Diagnosis not present

## 2013-07-11 DIAGNOSIS — F3289 Other specified depressive episodes: Secondary | ICD-10-CM

## 2013-07-11 DIAGNOSIS — E785 Hyperlipidemia, unspecified: Secondary | ICD-10-CM

## 2013-07-11 DIAGNOSIS — Z713 Dietary counseling and surveillance: Secondary | ICD-10-CM

## 2013-07-11 DIAGNOSIS — F329 Major depressive disorder, single episode, unspecified: Secondary | ICD-10-CM

## 2013-07-11 DIAGNOSIS — Z6833 Body mass index (BMI) 33.0-33.9, adult: Secondary | ICD-10-CM | POA: Insufficient documentation

## 2013-07-11 DIAGNOSIS — F32A Depression, unspecified: Secondary | ICD-10-CM

## 2013-07-11 NOTE — Progress Notes (Signed)
  Subjective:    Patient ID: Sheila Ferrell, female    DOB: 01-18-1940, 73 y.o.   MRN: [id number]  Patient here today fro follow up- no changes since last visit.  Hyperlipidemia This is a chronic problem. The current episode started more than 1 year ago. The problem is controlled. Recent lipid tests were reviewed and are high. There are no known factors aggravating her hyperlipidemia. Pertinent negatives include no focal sensory loss, leg pain or myalgias. Current antihyperlipidemic treatment includes statins. The current treatment provides moderate improvement of lipids. Compliance problems include adherence to diet and adherence to exercise.   Thyroid Problem Presents for follow-up visit. Patient reports no anxiety, cold intolerance, depressed mood, diarrhea, dry skin, fatigue, hoarse voice, leg swelling, menstrual problem, nail problem, tremors or visual change. The symptoms have been stable. Her past medical history is significant for hyperlipidemia.  GAD Takes ativan about every other day- Keeps her from getting anxious- No side effects Depression Zoloft keeps her from getting upset- Husband has dementia and sh eis his sole caregiver. Osteorporosis Fosamax every Monday- no C/o side effects  * C/o right arm pain with certain movement - not daily-   Review of Systems  Constitutional: Negative for fatigue.  HENT: Negative for hoarse voice.   Gastrointestinal: Negative for diarrhea.  Endocrine: Negative for cold intolerance.  Genitourinary: Negative for menstrual problem.  Musculoskeletal: Negative for myalgias.  Neurological: Negative for tremors.  All other systems reviewed and are negative.      Objective:   Physical Exam  Constitutional: She is oriented to person, place, and time. She appears well-developed and well-nourished.  HENT:  Nose: Nose normal.  Mouth/Throat: Oropharynx is clear and moist.  Eyes: EOM are normal.  Neck: Trachea normal, normal range of motion and  full passive range of motion without pain. Neck supple. No JVD present. Carotid bruit is not present. No thyromegaly present.  Cardiovascular: Normal rate, regular rhythm, normal heart sounds and intact distal pulses.  Exam reveals no gallop and no friction rub.   No murmur heard. Pulmonary/Chest: Effort normal and breath sounds normal.  Abdominal: Soft. Bowel sounds are normal. She exhibits no distension and no mass. There is no tenderness.  Musculoskeletal: Normal range of motion.  FROM of right shoulder- no point tenderness- Grips equal bilaterally.  Lymphadenopathy:    She has no cervical adenopathy.  Neurological: She is alert and oriented to person, place, and time. She has normal reflexes.  Skin: Skin is warm and dry.  Psychiatric: She has a normal mood and affect. Her behavior is normal. Judgment and thought content normal.    BP 132/80  Pulse 93  Temp(Src) 98.3 F (36.8 C) (Oral)  Ht _0  (1.549 m)  Wt 181 lb (82.101 kg)  BMI 34.22 kg/m2       Assessment & Plan:   1. Vitamin D deficiency   2. Osteoporosis, unspecified   3. Other specified hypothyroidism   4. Hyperlipidemia   5. GAD (generalized anxiety disorder)   6. Depression   7. BMI 34.0-34.9,adult   8. Weight loss counseling, encounter for    Orders Placed This Encounter  Procedures  . CMP14+EGFR  . NMR, lipoprofile  . Thyroid Panel With TSH    Labs pending Health maintenance reviewed Diet and exercise encouraged Continue all meds Follow up  In 3 month   [place], FNP

## 2013-07-11 NOTE — Patient Instructions (Signed)
Exercise to Lose Weight Exercise and a healthy diet may help you lose weight. Your doctor may suggest specific exercises. EXERCISE IDEAS AND TIPS  Choose low-cost things you enjoy doing, such as walking, bicycling, or exercising to workout videos.  Take stairs instead of the elevator.  Walk during your lunch break.  Park your car further away from work or school.  Go to a gym or an exercise class.  Start with 5 to 10 minutes of exercise each day. Build up to 30 minutes of exercise 4 to 6 days a week.  Wear shoes with good support and comfortable clothes.  Stretch before and after working out.  Work out until you breathe harder and your heart beats faster.  Drink extra water when you exercise.  Do not do so much that you hurt yourself, feel dizzy, or get very short of breath. Exercises that burn about 150 calories:  Running 1  miles in 15 minutes.  Playing volleyball for 45 to 60 minutes.  Washing and waxing a car for 45 to 60 minutes.  Playing touch football for 45 minutes.  Walking 1  miles in 35 minutes.  Pushing a stroller 1  miles in 30 minutes.  Playing basketball for 30 minutes.  Raking leaves for 30 minutes.  Bicycling 5 miles in 30 minutes.  Walking 2 miles in 30 minutes.  Dancing for 30 minutes.  Shoveling snow for 15 minutes.  Swimming laps for 20 minutes.  Walking up stairs for 15 minutes.  Bicycling 4 miles in 15 minutes.  Gardening for 30 to 45 minutes.  Jumping rope for 15 minutes.  Washing windows or floors for 45 to 60 minutes. Document Released: 01/25/2010 Document Revised: 03/17/2011 Document Reviewed: 01/25/2010 ExitCare Patient Information 2015 [employer]. This information is not intended to replace advice given to you by your health care provider. Make sure you discuss any questions you have with your health care provider.  

## 2013-07-12 DIAGNOSIS — F411 Generalized anxiety disorder: Secondary | ICD-10-CM | POA: Diagnosis not present

## 2013-07-12 DIAGNOSIS — E039 Hypothyroidism, unspecified: Secondary | ICD-10-CM | POA: Diagnosis not present

## 2013-07-12 DIAGNOSIS — H2589 Other age-related cataract: Secondary | ICD-10-CM | POA: Diagnosis not present

## 2013-07-12 DIAGNOSIS — Z87891 Personal history of nicotine dependence: Secondary | ICD-10-CM | POA: Diagnosis not present

## 2013-07-12 DIAGNOSIS — E7800 Pure hypercholesterolemia, unspecified: Secondary | ICD-10-CM | POA: Diagnosis not present

## 2013-07-12 DIAGNOSIS — M810 Age-related osteoporosis without current pathological fracture: Secondary | ICD-10-CM | POA: Diagnosis not present

## 2013-07-12 DIAGNOSIS — H2510 Age-related nuclear cataract, unspecified eye: Secondary | ICD-10-CM | POA: Diagnosis not present

## 2013-07-12 DIAGNOSIS — IMO0002 Reserved for concepts with insufficient information to code with codable children: Secondary | ICD-10-CM | POA: Diagnosis not present

## 2013-07-12 DIAGNOSIS — M1710 Unilateral primary osteoarthritis, unspecified knee: Secondary | ICD-10-CM | POA: Diagnosis not present

## 2013-07-12 LAB — CMP14+EGFR
ALBUMIN: 4.4 g/dL (ref 3.5–4.8)
ALT: 25 IU/L (ref 0–32)
AST: 21 IU/L (ref 0–40)
Albumin/Globulin Ratio: 2 (ref 1.1–2.5)
Alkaline Phosphatase: 116 IU/L (ref 39–117)
BUN/Creatinine Ratio: 12 (ref 11–26)
BUN: 10 mg/dL (ref 8–27)
CALCIUM: 9.7 mg/dL (ref 8.7–10.3)
CHLORIDE: 102 mmol/L (ref 97–108)
CO2: 27 mmol/L (ref 18–29)
CREATININE: 0.81 mg/dL (ref 0.57–1.00)
GFR calc Af Amer: 83 mL/min/{1.73_m2} (ref >59)
GFR calc non Af Amer: 72 mL/min/{1.73_m2} (ref >59)
GLOBULIN, TOTAL: 2.2 g/dL (ref 1.5–4.5)
GLUCOSE: 94 mg/dL (ref 65–99)
Potassium: 5 mmol/L (ref 3.5–5.2)
Sodium: 147 mmol/L — ABNORMAL HIGH (ref 134–144)
TOTAL PROTEIN: 6.6 g/dL (ref 6.0–8.5)
Total Bilirubin: 0.4 mg/dL (ref 0.0–1.2)

## 2013-07-12 LAB — NMR, LIPOPROFILE
CHOLESTEROL: 132 mg/dL (ref 100–199)
HDL CHOLESTEROL BY NMR: 44 mg/dL (ref >39)
HDL PARTICLE NUMBER: 33.5 umol/L (ref >=30.5)
LDL Particle Number: 622 nmol/L (ref <1000)
LDL Size: 20.2 nm (ref >20.5)
LDLC SERPL CALC-MCNC: 60 mg/dL (ref 0–99)
LP-IR Score: 63 — ABNORMAL HIGH (ref <=45)
Small LDL Particle Number: 332 nmol/L (ref <=527)
Triglycerides by NMR: 142 mg/dL (ref 0–149)

## 2013-07-13 DIAGNOSIS — Z9849 Cataract extraction status, unspecified eye: Secondary | ICD-10-CM | POA: Diagnosis not present

## 2013-07-13 DIAGNOSIS — Z961 Presence of intraocular lens: Secondary | ICD-10-CM | POA: Diagnosis not present

## 2013-07-13 DIAGNOSIS — Z48810 Encounter for surgical aftercare following surgery on the sense organs: Secondary | ICD-10-CM | POA: Diagnosis not present

## 2013-08-10 ENCOUNTER — Other Ambulatory Visit (INDEPENDENT_AMBULATORY_CARE_PROVIDER_SITE_OTHER): Payer: Medicare Other

## 2013-08-10 DIAGNOSIS — E039 Hypothyroidism, unspecified: Secondary | ICD-10-CM

## 2013-08-10 NOTE — Progress Notes (Signed)
Patient came in for labs only.

## 2013-08-11 LAB — THYROID PANEL WITH TSH
FREE THYROXINE INDEX: 2.3 (ref 1.2–4.9)
T3 UPTAKE RATIO: 30 % (ref 24–39)
T4, Total: 7.5 ug/dL (ref 4.5–12.0)
TSH: 0.484 u[IU]/mL (ref 0.450–4.500)

## 2013-08-25 ENCOUNTER — Other Ambulatory Visit: Payer: Self-pay | Admitting: Nurse Practitioner

## 2013-09-12 ENCOUNTER — Other Ambulatory Visit: Payer: Self-pay | Admitting: Nurse Practitioner

## 2013-09-18 ENCOUNTER — Other Ambulatory Visit: Payer: Self-pay | Admitting: Nurse Practitioner

## 2013-09-20 NOTE — Telephone Encounter (Signed)
Last ov 7/15. 

## 2013-09-21 DIAGNOSIS — Z1231 Encounter for screening mammogram for malignant neoplasm of breast: Secondary | ICD-10-CM | POA: Diagnosis not present

## 2013-10-12 ENCOUNTER — Ambulatory Visit: Payer: Medicare Other | Admitting: Nurse Practitioner

## 2013-10-12 ENCOUNTER — Encounter: Payer: Self-pay | Admitting: Nurse Practitioner

## 2013-10-12 ENCOUNTER — Ambulatory Visit (INDEPENDENT_AMBULATORY_CARE_PROVIDER_SITE_OTHER): Payer: Medicare Other | Admitting: Nurse Practitioner

## 2013-10-12 VITALS — BP 133/75 | HR 86 | Temp 97.6°F | Ht 61.0 in | Wt 175.6 lb

## 2013-10-12 DIAGNOSIS — F411 Generalized anxiety disorder: Secondary | ICD-10-CM

## 2013-10-12 DIAGNOSIS — Z01419 Encounter for gynecological examination (general) (routine) without abnormal findings: Secondary | ICD-10-CM

## 2013-10-12 DIAGNOSIS — F32A Depression, unspecified: Secondary | ICD-10-CM

## 2013-10-12 DIAGNOSIS — Z0000 Encounter for general adult medical examination without abnormal findings: Secondary | ICD-10-CM

## 2013-10-12 DIAGNOSIS — N3000 Acute cystitis without hematuria: Secondary | ICD-10-CM

## 2013-10-12 DIAGNOSIS — E034 Atrophy of thyroid (acquired): Secondary | ICD-10-CM | POA: Diagnosis not present

## 2013-10-12 DIAGNOSIS — E559 Vitamin D deficiency, unspecified: Secondary | ICD-10-CM

## 2013-10-12 DIAGNOSIS — E785 Hyperlipidemia, unspecified: Secondary | ICD-10-CM

## 2013-10-12 DIAGNOSIS — Z6834 Body mass index (BMI) 34.0-34.9, adult: Secondary | ICD-10-CM

## 2013-10-12 DIAGNOSIS — Z23 Encounter for immunization: Secondary | ICD-10-CM | POA: Diagnosis not present

## 2013-10-12 DIAGNOSIS — E0789 Other specified disorders of thyroid: Secondary | ICD-10-CM | POA: Diagnosis not present

## 2013-10-12 DIAGNOSIS — F329 Major depressive disorder, single episode, unspecified: Secondary | ICD-10-CM | POA: Diagnosis not present

## 2013-10-12 DIAGNOSIS — E038 Other specified hypothyroidism: Secondary | ICD-10-CM

## 2013-10-12 LAB — POCT CBC
Granulocyte percent: 67.9 %G (ref 37–80)
HCT, POC: 43.9 % (ref 37.7–47.9)
Hemoglobin: 13.4 g/dL (ref 12.2–16.2)
LYMPH, POC: 1.9 (ref 0.6–3.4)
MCH, POC: 29.2 pg (ref 27–31.2)
MCHC: 30.5 g/dL — AB (ref 31.8–35.4)
MCV: 95.8 fL (ref 80–97)
MPV: 7 fL (ref 0–99.8)
PLATELET COUNT, POC: 332 10*3/uL (ref 142–424)
POC GRANULOCYTE: 5.1 (ref 2–6.9)
POC LYMPH PERCENT: 25.3 %L (ref 10–50)
RBC: 4.6 M/uL (ref 4.04–5.48)
RDW, POC: 13.6 %
WBC: 7.5 10*3/uL (ref 4.6–10.2)

## 2013-10-12 LAB — POCT URINALYSIS DIPSTICK
BILIRUBIN UA: NEGATIVE
Blood, UA: NEGATIVE
GLUCOSE UA: NEGATIVE
Ketones, UA: NEGATIVE
NITRITE UA: POSITIVE
Protein, UA: NEGATIVE
SPEC GRAV UA: 1.01
UROBILINOGEN UA: NEGATIVE
pH, UA: 7

## 2013-10-12 LAB — POCT UA - MICROSCOPIC ONLY
Casts, Ur, LPF, POC: NEGATIVE
Crystals, Ur, HPF, POC: NEGATIVE
MUCUS UA: NEGATIVE
YEAST UA: NEGATIVE

## 2013-10-12 MED ORDER — SULFAMETHOXAZOLE-TMP DS 800-160 MG PO TABS
1.0000 | ORAL_TABLET | Freq: Two times a day (BID) | ORAL | Status: DC
Start: 2013-10-12 — End: 2013-10-14

## 2013-10-12 NOTE — Patient Instructions (Signed)

## 2013-10-12 NOTE — Progress Notes (Signed)
Subjective:    Patient ID: Sheila Ferrell, female    DOB: September 15, 1940, 73 y.o.   MRN: [id number]  Patient here today for Annual physical, pap and  follow up- no changes since last visit. She has no complaints today.  Hyperlipidemia This is a chronic problem. The current episode started more than 1 year ago. The problem is controlled. Recent lipid tests were reviewed and are high. There are no known factors aggravating her hyperlipidemia. Pertinent negatives include no focal sensory loss, leg pain or myalgias. Current antihyperlipidemic treatment includes statins. The current treatment provides moderate improvement of lipids. Compliance problems include adherence to diet and adherence to exercise.   Thyroid Problem Presents for follow-up visit. Patient reports no anxiety, cold intolerance, depressed mood, dry skin, fatigue, hoarse voice, leg swelling, menstrual problem, nail problem, tremors or visual change. The symptoms have been stable. Her past medical history is significant for hyperlipidemia.  GAD Takes ativan about every other day- Keeps her from getting anxious- No side effects Depression Zoloft keeps her from getting upset- Husband has dementia and she is his sole caregiver. Osteorporosis Fosamax every Monday- no C/o side effects     Review of Systems  Constitutional: Negative for fatigue.  HENT: Negative for hoarse voice.   Endocrine: Negative for cold intolerance.  Genitourinary: Negative for menstrual problem.  Musculoskeletal: Negative for myalgias.  Neurological: Negative for tremors.  All other systems reviewed and are negative.      Objective:   Physical Exam  Constitutional: She is oriented to person, place, and time. She appears well-developed and well-nourished.  HENT:  Head: Normocephalic.  Right Ear: Hearing, tympanic membrane, external ear and ear canal normal.  Left Ear: Hearing, tympanic membrane, external ear and ear canal normal.  Nose: Nose normal.   Mouth/Throat: Uvula is midline and oropharynx is clear and moist.  Eyes: Conjunctivae and EOM are normal. Pupils are equal, round, and reactive to light.  Neck: Trachea normal, normal range of motion and full passive range of motion without pain. Neck supple. No JVD present. Carotid bruit is not present. No mass and no thyromegaly present.  Cardiovascular: Normal rate, regular rhythm, normal heart sounds and intact distal pulses.  Exam reveals no gallop and no friction rub.   No murmur heard. Pulmonary/Chest: Effort normal and breath sounds normal. Right breast exhibits no inverted nipple, no mass, no nipple discharge, no skin change and no tenderness. Left breast exhibits no inverted nipple, no mass, no nipple discharge, no skin change and no tenderness.  Abdominal: Soft. Bowel sounds are normal. She exhibits no distension and no mass. There is no tenderness.  Genitourinary: Vagina normal and uterus normal. No breast swelling, tenderness, discharge or bleeding.  bimanual exam-No adnexal masses or tenderness. Cervix parous and pink- no discharge  Musculoskeletal: Normal range of motion.  FROM of right shoulder- no point tenderness- Grips equal bilaterally.  Lymphadenopathy:    She has no cervical adenopathy.  Neurological: She is alert and oriented to person, place, and time. She has normal reflexes.  Skin: Skin is warm and dry.  Psychiatric: She has a normal mood and affect. Her behavior is normal. Judgment and thought content normal.    BP 133/75  Pulse 86  Temp(Src) 97.6 F (36.4 C) (Oral)  Ht 5\' 1"  (1.549 m)  Wt 175 lb 9.6 oz (79.652 kg)  BMI 33.20 kg/m2  Results for orders placed in visit on 10/12/13  POCT UA - MICROSCOPIC ONLY      Result Value Ref  Range   WBC, Ur, HPF, POC 40-50     RBC, urine, microscopic 1-5     Bacteria, U Microscopic MANY     Mucus, UA NEG     Epithelial cells, urine per micros OCC     Crystals, Ur, HPF, POC NEG     Casts, Ur, LPF, POC NEG     Yeast,  UA NEG    POCT URINALYSIS DIPSTICK      Result Value Ref Range   Color, UA YELLOW     Clarity, UA CLOUDY     Glucose, UA NEG     Bilirubin, UA NEG     Ketones, UA NEG     Spec Grav, UA 1.010     Blood, UA NEG     pH, UA 7.0     Protein, UA NEG     Urobilinogen, UA negative     Nitrite, UA POS     Leukocytes, UA large (3+)          Assessment & Plan:  1. Annual physical exam - POCT UA - Microscopic Only - POCT urinalysis dipstick  2. Encounter for routine gynecological examination - Pap IG (Image Guided)  3. Acute cystitis without hematuria Force fluids AZO over the counter X2 days RTO prn Culture pending - sulfamethoxazole-trimethoprim (BACTRIM DS) 800-160 MG per tablet; Take 1 tablet by mouth 2 (two) times daily.  Dispense: 20 tablet; Refill: 0 - POCT CBC  4. Hyperlipidemia Low fat diet - CMP14+EGFR - NMR, lipoprofile  5. Hypothyroidism due to acquired atrophy of thyroid - Thyroid Panel With TSH  6. GAD (generalized anxiety disorder) Stress management  7. Depression   8. BMI 34.0-34.9,adult  9. Vitamin D deficiency    Labs pending Health maintenance reviewed Diet and exercise encouraged Continue all meds Follow up  In 3 months   [place], FNP

## 2013-10-13 LAB — CMP14+EGFR
ALK PHOS: 101 IU/L (ref 39–117)
ALT: 21 IU/L (ref 0–32)
AST: 23 IU/L (ref 0–40)
Albumin/Globulin Ratio: 1.8 (ref 1.1–2.5)
Albumin: 4.7 g/dL (ref 3.5–4.8)
BUN/Creatinine Ratio: 13 (ref 11–26)
BUN: 11 mg/dL (ref 8–27)
CALCIUM: 9.9 mg/dL (ref 8.7–10.3)
CHLORIDE: 100 mmol/L (ref 97–108)
CO2: 23 mmol/L (ref 18–29)
CREATININE: 0.86 mg/dL (ref 0.57–1.00)
GFR calc Af Amer: 78 mL/min/{1.73_m2} (ref >59)
GFR calc non Af Amer: 67 mL/min/{1.73_m2} (ref >59)
GLOBULIN, TOTAL: 2.6 g/dL (ref 1.5–4.5)
Glucose: 92 mg/dL (ref 65–99)
Potassium: 4.7 mmol/L (ref 3.5–5.2)
SODIUM: 143 mmol/L (ref 134–144)
Total Bilirubin: 0.4 mg/dL (ref 0.0–1.2)
Total Protein: 7.3 g/dL (ref 6.0–8.5)

## 2013-10-13 LAB — NMR, LIPOPROFILE
Cholesterol: 139 mg/dL (ref 100–199)
HDL CHOLESTEROL BY NMR: 50 mg/dL (ref >39)
HDL Particle Number: 36.8 umol/L (ref >=30.5)
LDL PARTICLE NUMBER: 770 nmol/L (ref <1000)
LDL Size: 21.1 nm (ref >20.5)
LDLC SERPL CALC-MCNC: 60 mg/dL (ref 0–99)
LP-IR Score: 53 — ABNORMAL HIGH (ref <=45)
Small LDL Particle Number: 324 nmol/L (ref <=527)
Triglycerides by NMR: 146 mg/dL (ref 0–149)

## 2013-10-13 LAB — PAP IG (IMAGE GUIDED): PAP SMEAR COMMENT: 0

## 2013-10-13 LAB — THYROID PANEL WITH TSH
Free Thyroxine Index: 2.9 (ref 1.2–4.9)
T3 Uptake Ratio: 30 % (ref 24–39)
T4 TOTAL: 9.7 ug/dL (ref 4.5–12.0)
TSH: 0.835 u[IU]/mL (ref 0.450–4.500)

## 2013-10-14 ENCOUNTER — Other Ambulatory Visit: Payer: Self-pay | Admitting: Nurse Practitioner

## 2013-10-14 ENCOUNTER — Telehealth: Payer: Self-pay | Admitting: Family Medicine

## 2013-10-14 DIAGNOSIS — N3000 Acute cystitis without hematuria: Secondary | ICD-10-CM

## 2013-10-14 MED ORDER — SULFAMETHOXAZOLE-TMP DS 800-160 MG PO TABS: 1.0000 | ORAL_TABLET | Freq: Two times a day (BID) | ORAL | Status: DC

## 2013-10-14 NOTE — Telephone Encounter (Signed)
Bactrim order was sent in and we have statement that they received rx.

## 2013-10-18 ENCOUNTER — Other Ambulatory Visit: Payer: Self-pay | Admitting: Nurse Practitioner

## 2013-10-18 ENCOUNTER — Other Ambulatory Visit: Payer: Self-pay | Admitting: *Deleted

## 2013-10-18 DIAGNOSIS — E034 Atrophy of thyroid (acquired): Secondary | ICD-10-CM

## 2013-10-18 MED ORDER — ALENDRONATE SODIUM 70 MG PO TABS: 70.0000 mg | ORAL_TABLET | ORAL | Status: DC

## 2013-10-18 MED ORDER — SERTRALINE HCL 50 MG PO TABS: 50.0000 mg | ORAL_TABLET | Freq: Every day | ORAL | Status: DC

## 2013-10-18 MED ORDER — ATORVASTATIN CALCIUM 20 MG PO TABS: 20.0000 mg | ORAL_TABLET | Freq: Every day | ORAL | Status: DC

## 2013-10-18 MED ORDER — SYNTHROID 112 MCG PO TABS: 112.0000 ug | ORAL_TABLET | Freq: Every day | ORAL | Status: DC

## 2013-10-19 ENCOUNTER — Other Ambulatory Visit: Payer: Self-pay | Admitting: Family Medicine

## 2013-10-19 DIAGNOSIS — E034 Atrophy of thyroid (acquired): Secondary | ICD-10-CM

## 2013-10-19 MED ORDER — SYNTHROID 112 MCG PO TABS: 112.0000 ug | ORAL_TABLET | Freq: Every day | ORAL | Status: DC

## 2013-11-02 ENCOUNTER — Other Ambulatory Visit: Payer: Self-pay | Admitting: Nurse Practitioner

## 2013-11-24 ENCOUNTER — Other Ambulatory Visit: Payer: Self-pay | Admitting: Nurse Practitioner

## 2013-12-01 ENCOUNTER — Other Ambulatory Visit: Payer: Self-pay | Admitting: Nurse Practitioner

## 2013-12-05 ENCOUNTER — Other Ambulatory Visit: Payer: Self-pay

## 2013-12-05 MED ORDER — SERTRALINE HCL 50 MG PO TABS: 50.0000 mg | ORAL_TABLET | Freq: Every day | ORAL | Status: DC

## 2014-01-16 ENCOUNTER — Ambulatory Visit: Payer: Medicare Other | Admitting: Nurse Practitioner

## 2014-01-27 ENCOUNTER — Ambulatory Visit: Payer: Medicare Other | Admitting: Nurse Practitioner

## 2014-02-15 ENCOUNTER — Ambulatory Visit (INDEPENDENT_AMBULATORY_CARE_PROVIDER_SITE_OTHER): Payer: Medicare Other | Admitting: Nurse Practitioner

## 2014-02-15 ENCOUNTER — Encounter: Payer: Self-pay | Admitting: Nurse Practitioner

## 2014-02-15 VITALS — BP 150/86 | HR 100 | Temp 97.2°F | Ht 61.0 in | Wt 179.0 lb

## 2014-02-15 DIAGNOSIS — E559 Vitamin D deficiency, unspecified: Secondary | ICD-10-CM | POA: Diagnosis not present

## 2014-02-15 DIAGNOSIS — F329 Major depressive disorder, single episode, unspecified: Secondary | ICD-10-CM

## 2014-02-15 DIAGNOSIS — M810 Age-related osteoporosis without current pathological fracture: Secondary | ICD-10-CM

## 2014-02-15 DIAGNOSIS — E034 Atrophy of thyroid (acquired): Secondary | ICD-10-CM | POA: Diagnosis not present

## 2014-02-15 DIAGNOSIS — F32A Depression, unspecified: Secondary | ICD-10-CM

## 2014-02-15 DIAGNOSIS — F411 Generalized anxiety disorder: Secondary | ICD-10-CM | POA: Diagnosis not present

## 2014-02-15 DIAGNOSIS — Z6834 Body mass index (BMI) 34.0-34.9, adult: Secondary | ICD-10-CM

## 2014-02-15 DIAGNOSIS — E038 Other specified hypothyroidism: Secondary | ICD-10-CM

## 2014-02-15 DIAGNOSIS — E785 Hyperlipidemia, unspecified: Secondary | ICD-10-CM

## 2014-02-15 MED ORDER — ATORVASTATIN CALCIUM 20 MG PO TABS: 20.0000 mg | ORAL_TABLET | Freq: Every day | ORAL | Status: DC

## 2014-02-15 MED ORDER — SYNTHROID 112 MCG PO TABS: 112.0000 ug | ORAL_TABLET | Freq: Every day | ORAL | Status: DC

## 2014-02-15 MED ORDER — ALENDRONATE SODIUM 70 MG PO TABS: 70.0000 mg | ORAL_TABLET | ORAL | Status: DC

## 2014-02-15 MED ORDER — SERTRALINE HCL 50 MG PO TABS: 50.0000 mg | ORAL_TABLET | Freq: Every day | ORAL | Status: DC

## 2014-02-15 NOTE — Patient Instructions (Signed)

## 2014-02-15 NOTE — Progress Notes (Signed)
Subjective:    Patient ID: Sheila Ferrell, female    DOB: 06-11-1940, 74 y.o.   MRN: [id number]  Patient here today for chronic disease follow up, pt c/o right knee pain that started 2 weeks ago, rate pain a 5/10. She has tried aleve with some relief.    Hyperlipidemia This is a chronic problem. The current episode started more than 1 year ago. Recent lipid tests were reviewed and are high. Pertinent negatives include no chest pain, focal sensory loss, focal weakness, myalgias or shortness of breath. Current antihyperlipidemic treatment includes statins. Compliance problems include adherence to diet.  Risk factors for coronary artery disease include dyslipidemia and post-menopausal.  Thyroid Problem Presents for follow-up visit. Patient reports no cold intolerance, constipation, fatigue, menstrual problem, tremors or visual change. The symptoms have been stable. The treatment provided significant relief. Her past medical history is significant for hyperlipidemia. There are no known risk factors.  GAD Stress management not currently on any antianxiety meds.  Depression Zoloft keeps her from getting upset- Husband has dementia and she is his sole caregiver. Osteorporosis Fosamax every Monday- no C/o side effects     Review of Systems  Constitutional: Negative.  Negative for fatigue.  HENT: Negative.   Respiratory: Negative.  Negative for shortness of breath.   Cardiovascular: Negative.  Negative for chest pain.  Gastrointestinal: Negative.  Negative for constipation.  Endocrine: Negative.  Negative for cold intolerance.  Genitourinary: Negative.  Negative for menstrual problem.  Musculoskeletal: Positive for gait problem (right knee pain. ). Negative for myalgias.  Skin: Negative.   Allergic/Immunologic: Negative.   Neurological: Negative.  Negative for tremors and focal weakness.  Hematological: Negative.   Psychiatric/Behavioral: Negative.   All other systems reviewed and are  negative.      Objective:   Physical Exam  Constitutional: She is oriented to person, place, and time. She appears well-developed and well-nourished.  HENT:  Head: Normocephalic.  Right Ear: Hearing, tympanic membrane, external ear and ear canal normal.  Left Ear: Hearing, tympanic membrane, external ear and ear canal normal.  Nose: Nose normal.  Mouth/Throat: Uvula is midline and oropharynx is clear and moist.  Eyes: Conjunctivae and EOM are normal. Pupils are equal, round, and reactive to light.  Neck: Trachea normal, normal range of motion and full passive range of motion without pain. Neck supple. No JVD present. Carotid bruit is not present. No thyroid mass and no thyromegaly present.  Cardiovascular: Normal rate, regular rhythm, normal heart sounds and intact distal pulses.  Exam reveals no gallop and no friction rub.   No murmur heard. Abdominal: Soft. Bowel sounds are normal. She exhibits no distension and no mass. There is no tenderness.  Genitourinary: Vagina normal and uterus normal. No breast swelling, tenderness, discharge or bleeding.  Musculoskeletal: Normal range of motion.  Lymphadenopathy:    She has no cervical adenopathy.  Neurological: She is alert and oriented to person, place, and time. She has normal reflexes.  Skin: Skin is warm and dry.  Psychiatric: She has a normal mood and affect. Her behavior is normal. Judgment and thought content normal.    BP 150/86 mmHg  Pulse 100  Temp(Src) 97.2 F (36.2 C) (Oral)  Ht '5\' 1"'  (1.549 m)  Wt 179 lb (81.194 kg)  BMI 33.84 kg/m2        Assessment & Plan:   1. Hyperlipidemia Low fat diet - NMR, lipoprofile - CMP14+EGFR - atorvastatin (LIPITOR) 20 MG tablet; Take 1 tablet (20 mg total) by  mouth daily at 6 PM.  Dispense: 90 tablet; Refill: 1  2. Depression Stress management - sertraline (ZOLOFT) 50 MG tablet; Take 1 tablet (50 mg total) by mouth daily.  Dispense: 90 tablet; Refill: 1  3. GAD (generalized  anxiety disorder) Stress management 4. Hypothyroidism due to acquired atrophy of thyroid  - SYNTHROID 112 MCG tablet; Take 1 tablet (112 mcg total) by mouth daily before breakfast.  Dispense: 90 tablet; Refill: 1  5. Vitamin D deficiency OTC vit D supplement  6. BMI 34.0-34.9,adult Discussed diet and exercise for person with BMI >25 Will recheck weight in 3-6 months  7. Osteoporosis - alendronate (FOSAMAX) 70 MG tablet; Take 1 tablet (70 mg total) by mouth once a week. Take with a full glass of water on an empty stomach.  Dispense: 12 tablet; Refill: 1    Labs pending Health maintenance reviewed Diet and exercise encouraged Continue all meds Follow up  In 3 months   [place], FNP

## 2014-02-15 NOTE — Addendum Note (Signed)
Addended by: Wyline Mood on: 02/15/2014 05:54 PM   Modules accepted: Orders

## 2014-02-16 LAB — CMP14+EGFR
A/G RATIO: 1.6 (ref 1.1–2.5)
ALBUMIN: 4.5 g/dL (ref 3.5–4.8)
ALK PHOS: 110 IU/L (ref 39–117)
ALT: 20 IU/L (ref 0–32)
AST: 29 IU/L (ref 0–40)
BUN / CREAT RATIO: 20 (ref 11–26)
BUN: 15 mg/dL (ref 8–27)
Bilirubin Total: 0.2 mg/dL (ref 0.0–1.2)
CHLORIDE: 99 mmol/L (ref 97–108)
CO2: 29 mmol/L (ref 18–29)
Calcium: 9.9 mg/dL (ref 8.7–10.3)
Creatinine, Ser: 0.74 mg/dL (ref 0.57–1.00)
GFR calc Af Amer: 93 mL/min/{1.73_m2} (ref >59)
GFR calc non Af Amer: 81 mL/min/{1.73_m2} (ref >59)
GLOBULIN, TOTAL: 2.9 g/dL (ref 1.5–4.5)
Glucose: 85 mg/dL (ref 65–99)
POTASSIUM: 5.1 mmol/L (ref 3.5–5.2)
SODIUM: 141 mmol/L (ref 134–144)
Total Protein: 7.4 g/dL (ref 6.0–8.5)

## 2014-02-16 LAB — LIPID PANEL
CHOLESTEROL TOTAL: 141 mg/dL (ref 100–199)
Chol/HDL Ratio: 2.9 ratio units (ref 0.0–4.4)
HDL: 48 mg/dL (ref >39)
LDL Calculated: 62 mg/dL (ref 0–99)
TRIGLYCERIDES: 155 mg/dL — AB (ref 0–149)
VLDL CHOLESTEROL CAL: 31 mg/dL (ref 5–40)

## 2014-05-18 ENCOUNTER — Ambulatory Visit (INDEPENDENT_AMBULATORY_CARE_PROVIDER_SITE_OTHER): Payer: Medicare Other | Admitting: Nurse Practitioner

## 2014-05-18 ENCOUNTER — Encounter: Payer: Self-pay | Admitting: Nurse Practitioner

## 2014-05-18 VITALS — BP 138/82 | HR 87 | Temp 97.8°F | Ht 61.0 in | Wt 181.0 lb

## 2014-05-18 DIAGNOSIS — F329 Major depressive disorder, single episode, unspecified: Secondary | ICD-10-CM

## 2014-05-18 DIAGNOSIS — E785 Hyperlipidemia, unspecified: Secondary | ICD-10-CM

## 2014-05-18 DIAGNOSIS — F32A Depression, unspecified: Secondary | ICD-10-CM

## 2014-05-18 DIAGNOSIS — F411 Generalized anxiety disorder: Secondary | ICD-10-CM | POA: Diagnosis not present

## 2014-05-18 DIAGNOSIS — E038 Other specified hypothyroidism: Secondary | ICD-10-CM | POA: Diagnosis not present

## 2014-05-18 DIAGNOSIS — M810 Age-related osteoporosis without current pathological fracture: Secondary | ICD-10-CM | POA: Diagnosis not present

## 2014-05-18 DIAGNOSIS — E034 Atrophy of thyroid (acquired): Secondary | ICD-10-CM

## 2014-05-18 DIAGNOSIS — E559 Vitamin D deficiency, unspecified: Secondary | ICD-10-CM | POA: Diagnosis not present

## 2014-05-18 DIAGNOSIS — Z23 Encounter for immunization: Secondary | ICD-10-CM

## 2014-05-18 DIAGNOSIS — Z6834 Body mass index (BMI) 34.0-34.9, adult: Secondary | ICD-10-CM

## 2014-05-18 NOTE — Progress Notes (Signed)
Subjective:    Patient ID: Sheila Ferrell, female    DOB: 1941-01-04, 74 y.o.   MRN: [id number]  Patient here today for chronic disease follow up, pt c/o right knee pain that started 2 weeks ago, rate pain a 5/10. She has tried aleve with some relief.    Hyperlipidemia This is a chronic problem. The current episode started more than 1 year ago. Recent lipid tests were reviewed and are high. Pertinent negatives include no chest pain, focal sensory loss, focal weakness, myalgias or shortness of breath. Current antihyperlipidemic treatment includes statins. The current treatment provides moderate improvement of lipids. Compliance problems include adherence to diet and adherence to exercise.  Risk factors for coronary artery disease include dyslipidemia and post-menopausal.  Thyroid Problem Presents for follow-up visit. Patient reports no cold intolerance, constipation, fatigue, menstrual problem, tremors or visual change. The symptoms have been stable. The treatment provided significant relief. Her past medical history is significant for hyperlipidemia. There are no known risk factors.  GAD Stress management not currently on any antianxiety meds.  Depression Zoloft keeps her from getting upset- Husband has dementia and she is his sole caregiver. Osteorporosis Fosamax every Monday- no C/o side effects Vitamin d deficiency suppose to be taking vitamin d OTC but forgets to take daily   Review of Systems  Constitutional: Negative.  Negative for fatigue.  HENT: Negative.   Respiratory: Negative.  Negative for shortness of breath.   Cardiovascular: Negative.  Negative for chest pain.  Gastrointestinal: Negative.  Negative for constipation.  Endocrine: Negative.  Negative for cold intolerance.  Genitourinary: Negative.  Negative for menstrual problem.  Musculoskeletal: Positive for gait problem (right knee pain. ). Negative for myalgias.  Skin: Negative.   Allergic/Immunologic: Negative.    Neurological: Negative.  Negative for tremors and focal weakness.  Hematological: Negative.   Psychiatric/Behavioral: Negative.   All other systems reviewed and are negative.      Objective:   Physical Exam  Constitutional: She is oriented to person, place, and time. She appears well-developed and well-nourished.  HENT:  Head: Normocephalic.  Right Ear: Hearing, tympanic membrane, external ear and ear canal normal.  Left Ear: Hearing, tympanic membrane, external ear and ear canal normal.  Nose: Nose normal.  Mouth/Throat: Uvula is midline and oropharynx is clear and moist.  Eyes: Conjunctivae and EOM are normal. Pupils are equal, round, and reactive to light.  Neck: Trachea normal, normal range of motion and full passive range of motion without pain. Neck supple. No JVD present. Carotid bruit is not present. No thyroid mass and no thyromegaly present.  Cardiovascular: Normal rate, regular rhythm, normal heart sounds and intact distal pulses.  Exam reveals no gallop and no friction rub.   No murmur heard. Abdominal: Soft. Bowel sounds are normal. She exhibits no distension and no mass. There is no tenderness.  Genitourinary: Vagina normal and uterus normal. No breast swelling, tenderness, discharge or bleeding.  Musculoskeletal: Normal range of motion.  Lymphadenopathy:    She has no cervical adenopathy.  Neurological: She is alert and oriented to person, place, and time. She has normal reflexes.  Skin: Skin is warm and dry.  Psychiatric: She has a normal mood and affect. Her behavior is normal. Judgment and thought content normal.    BP 138/82 mmHg  Pulse 87  Temp(Src) 97.8 F (36.6 C) (Oral)  Ht '5\' 1"'  (1.549 m)  Wt 181 lb (82.101 kg)  BMI 34.22 kg/m2         Assessment &  Plan:   1. Hypothyroidism due to acquired atrophy of thyroid  2. Osteoporosis Weight bearing exercises  3. Vitamin D deficiency  4. Hyperlipidemia Low fat diet - CMP14+EGFR - NMR,  lipoprofile  5. Depression Stress management  6. BMI 34.0-34.9,adult Discussed diet and exercise for person with BMI >25 Will recheck weight in 3-6 months   7. GAD (generalized anxiety disorder) Stress management    Labs pending Health maintenance reviewed Diet and exercise encouraged Continue all meds Follow up  In 3 months   [place], FNP

## 2014-05-18 NOTE — Addendum Note (Signed)
Addended by: Earlene Plater on: 05/18/2014 02:35 PM   Modules accepted: Miquel Dunn

## 2014-05-18 NOTE — Patient Instructions (Signed)
Bone Health Our bones do many things. They provide structure, protect organs, anchor muscles, and store calcium. Adequate calcium in your diet and weight-bearing physical activity help build strong bones, improve bone amounts, and may reduce the risk of weakening of bones (osteoporosis) later in life. PEAK BONE MASS By age 74, the average woman has acquired most of her skeletal bone mass. A large decline occurs in older adults which increases the risk of osteoporosis. In women this occurs around the time of menopause. It is important for young girls to reach their peak bone mass in order to maintain bone health throughout life. A person with high bone mass as a young adult will be more likely to have a higher bone mass later in life. Not enough calcium consumption and physical activity early on could result in a failure to achieve optimum bone mass in adulthood. OSTEOPOROSIS Osteoporosis is a disease of the bones. It is defined as low bone mass with deterioration of bone structure. Osteoporosis leads to an increase risk of fractures with falls. These fractures commonly happen in the wrist, hip, and spine. While men and women of all ages and background can develop osteoporosis, some of the risk factors for osteoporosis are:  Female.  White.  Postmenopausal.  Older adults.  Small in body size.  Eating a diet low in calcium.  Physically inactive.  Smoking.  Use of some medications.  Family history. CALCIUM Calcium is a mineral needed by the body for healthy bones, teeth, and proper function of the heart, muscles, and nerves. The body cannot produce calcium so it must be absorbed through food. Good sources of calcium include:  Dairy products (low fat or nonfat milk, cheese, and yogurt).  Dark green leafy vegetables (bok choy and broccoli).  Calcium fortified foods (orange juice, cereal, bread, soy beverages, and tofu products).  Nuts (almonds). Recommended amounts of calcium vary  for individuals. RECOMMENDED CALCIUM INTAKES Age and Amount in mg per day  Children 1 to 3 years / 700 mg  Children 4 to 8 years / 1,000 mg  Children 9 to 13 years / 1,300 mg  Teens 14 to 18 years / 1,300 mg  Adults 19 to 50 years / 1,000 mg  Adult women 51 to 70 years / 1,200 mg  Adults 71 years and older / 1,200 mg  Pregnant and breastfeeding teens / 1,300 mg  Pregnant and breastfeeding adults / 1,000 mg Vitamin D also plays an important role in healthy bone development. Vitamin D helps in the absorption of calcium. WEIGHT-BEARING PHYSICAL ACTIVITY Regular physical activity has many positive health benefits. Benefits include strong bones. Weight-bearing physical activity early in life is important in reaching peak bone mass. Weight-bearing physical activities cause muscles and bones to work against gravity. Some examples of weight bearing physical activities include:  Walking, jogging, or running.  Field Hockey.  Jumping rope.  Dancing.  Soccer.  Tennis or Racquetball.  Stair climbing.  Basketball.  Hiking.  Weight lifting.  Aerobic fitness classes. Including weight-bearing physical activity into an exercise plan is a great way to keep bones healthy. Adults: Engage in at least 30 minutes of moderate physical activity on most, preferably all, days of the week. Children: Engage in at least 60 minutes of moderate physical activity on most, preferably all, days of the week. FOR MORE INFORMATION [place] [employer] Agriculture, Center for Nutrition Policy and Promotion: www.cnpp.usda.gov National Osteoporosis Foundation: www.nof.org Document Released: 03/15/2003 Document Revised: 04/19/2012 Document Reviewed: 06/14/2008 ExitCare Patient Information   2015 [employer]. This information is not intended to replace advice given to you by your health care provider. Make sure you discuss any questions you have with your health care provider.  

## 2014-05-19 LAB — CMP14+EGFR
ALK PHOS: 102 IU/L (ref 39–117)
ALT: 27 IU/L (ref 0–32)
AST: 24 IU/L (ref 0–40)
Albumin/Globulin Ratio: 1.7 (ref 1.1–2.5)
Albumin: 4.3 g/dL (ref 3.5–4.8)
BUN/Creatinine Ratio: 14 (ref 11–26)
BUN: 10 mg/dL (ref 8–27)
Bilirubin Total: 0.3 mg/dL (ref 0.0–1.2)
CALCIUM: 9.5 mg/dL (ref 8.7–10.3)
CO2: 27 mmol/L (ref 18–29)
Chloride: 101 mmol/L (ref 97–108)
Creatinine, Ser: 0.74 mg/dL (ref 0.57–1.00)
GFR calc Af Amer: 92 mL/min/{1.73_m2} (ref >59)
GFR, EST NON AFRICAN AMERICAN: 80 mL/min/{1.73_m2} (ref >59)
GLUCOSE: 91 mg/dL (ref 65–99)
Globulin, Total: 2.6 g/dL (ref 1.5–4.5)
POTASSIUM: 4.4 mmol/L (ref 3.5–5.2)
Sodium: 143 mmol/L (ref 134–144)
Total Protein: 6.9 g/dL (ref 6.0–8.5)

## 2014-05-19 LAB — NMR, LIPOPROFILE
CHOLESTEROL: 124 mg/dL (ref 100–199)
HDL Cholesterol by NMR: 42 mg/dL (ref >39)
HDL Particle Number: 31.3 umol/L (ref >=30.5)
LDL PARTICLE NUMBER: 676 nmol/L (ref <1000)
LDL Size: 20.7 nm (ref >20.5)
LDL-C: 50 mg/dL (ref 0–99)
LP-IR Score: 63 — ABNORMAL HIGH (ref <=45)
Small LDL Particle Number: 457 nmol/L (ref <=527)
TRIGLYCERIDES BY NMR: 159 mg/dL — AB (ref 0–149)

## 2014-08-01 ENCOUNTER — Other Ambulatory Visit: Payer: Self-pay | Admitting: Nurse Practitioner

## 2014-08-12 ENCOUNTER — Other Ambulatory Visit: Payer: Self-pay | Admitting: Nurse Practitioner

## 2014-08-24 ENCOUNTER — Encounter: Payer: Self-pay | Admitting: Nurse Practitioner

## 2014-08-24 ENCOUNTER — Other Ambulatory Visit: Payer: Medicare Other

## 2014-08-24 ENCOUNTER — Ambulatory Visit (INDEPENDENT_AMBULATORY_CARE_PROVIDER_SITE_OTHER): Payer: Medicare Other | Admitting: Nurse Practitioner

## 2014-08-24 VITALS — BP 120/73 | HR 85 | Temp 97.3°F | Ht 61.0 in | Wt 175.0 lb

## 2014-08-24 DIAGNOSIS — E559 Vitamin D deficiency, unspecified: Secondary | ICD-10-CM | POA: Diagnosis not present

## 2014-08-24 DIAGNOSIS — E038 Other specified hypothyroidism: Secondary | ICD-10-CM | POA: Diagnosis not present

## 2014-08-24 DIAGNOSIS — E034 Atrophy of thyroid (acquired): Secondary | ICD-10-CM | POA: Diagnosis not present

## 2014-08-24 DIAGNOSIS — F32A Depression, unspecified: Secondary | ICD-10-CM

## 2014-08-24 DIAGNOSIS — M810 Age-related osteoporosis without current pathological fracture: Secondary | ICD-10-CM

## 2014-08-24 DIAGNOSIS — F411 Generalized anxiety disorder: Secondary | ICD-10-CM | POA: Diagnosis not present

## 2014-08-24 DIAGNOSIS — E785 Hyperlipidemia, unspecified: Secondary | ICD-10-CM | POA: Diagnosis not present

## 2014-08-24 DIAGNOSIS — F329 Major depressive disorder, single episode, unspecified: Secondary | ICD-10-CM

## 2014-08-24 DIAGNOSIS — R799 Abnormal finding of blood chemistry, unspecified: Secondary | ICD-10-CM | POA: Diagnosis not present

## 2014-08-24 DIAGNOSIS — Z6833 Body mass index (BMI) 33.0-33.9, adult: Secondary | ICD-10-CM

## 2014-08-24 MED ORDER — ALENDRONATE SODIUM 70 MG PO TABS: ORAL_TABLET | ORAL | Status: DC

## 2014-08-24 MED ORDER — SERTRALINE HCL 50 MG PO TABS: 50.0000 mg | ORAL_TABLET | Freq: Every day | ORAL | Status: DC

## 2014-08-24 MED ORDER — SYNTHROID 112 MCG PO TABS: 112.0000 ug | ORAL_TABLET | Freq: Every day | ORAL | Status: DC

## 2014-08-24 MED ORDER — ATORVASTATIN CALCIUM 20 MG PO TABS: ORAL_TABLET | ORAL | Status: DC

## 2014-08-24 NOTE — Patient Instructions (Signed)
Bone Health Our bones do many things. They provide structure, protect organs, anchor muscles, and store calcium. Adequate calcium in your diet and weight-bearing physical activity help build strong bones, improve bone amounts, and may reduce the risk of weakening of bones (osteoporosis) later in life. PEAK BONE MASS By age 74, the average woman has acquired most of her skeletal bone mass. A large decline occurs in older adults which increases the risk of osteoporosis. In women this occurs around the time of menopause. It is important for young girls to reach their peak bone mass in order to maintain bone health throughout life. A person with high bone mass as a young adult will be more likely to have a higher bone mass later in life. Not enough calcium consumption and physical activity early on could result in a failure to achieve optimum bone mass in adulthood. OSTEOPOROSIS Osteoporosis is a disease of the bones. It is defined as low bone mass with deterioration of bone structure. Osteoporosis leads to an increase risk of fractures with falls. These fractures commonly happen in the wrist, hip, and spine. While men and women of all ages and background can develop osteoporosis, some of the risk factors for osteoporosis are:  Female.  White.  Postmenopausal.  Older adults.  Small in body size.  Eating a diet low in calcium.  Physically inactive.  Smoking.  Use of some medications.  Family history. CALCIUM Calcium is a mineral needed by the body for healthy bones, teeth, and proper function of the heart, muscles, and nerves. The body cannot produce calcium so it must be absorbed through food. Good sources of calcium include:  Dairy products (low fat or nonfat milk, cheese, and yogurt).  Dark green leafy vegetables (bok choy and broccoli).  Calcium fortified foods (orange juice, cereal, bread, soy beverages, and tofu products).  Nuts (almonds). Recommended amounts of calcium vary  for individuals. RECOMMENDED CALCIUM INTAKES Age and Amount in mg per day  Children 1 to 3 years / 700 mg  Children 4 to 8 years / 1,000 mg  Children 9 to 13 years / 1,300 mg  Teens 14 to 18 years / 1,300 mg  Adults 19 to 50 years / 1,000 mg  Adult women 51 to 70 years / 1,200 mg  Adults 71 years and older / 1,200 mg  Pregnant and breastfeeding teens / 1,300 mg  Pregnant and breastfeeding adults / 1,000 mg Vitamin D also plays an important role in healthy bone development. Vitamin D helps in the absorption of calcium. WEIGHT-BEARING PHYSICAL ACTIVITY Regular physical activity has many positive health benefits. Benefits include strong bones. Weight-bearing physical activity early in life is important in reaching peak bone mass. Weight-bearing physical activities cause muscles and bones to work against gravity. Some examples of weight bearing physical activities include:  Walking, jogging, or running.  Field Hockey.  Jumping rope.  Dancing.  Soccer.  Tennis or Racquetball.  Stair climbing.  Basketball.  Hiking.  Weight lifting.  Aerobic fitness classes. Including weight-bearing physical activity into an exercise plan is a great way to keep bones healthy. Adults: Engage in at least 30 minutes of moderate physical activity on most, preferably all, days of the week. Children: Engage in at least 60 minutes of moderate physical activity on most, preferably all, days of the week. FOR MORE INFORMATION [place] [employer] Agriculture, Center for Nutrition Policy and Promotion: www.cnpp.usda.gov National Osteoporosis Foundation: www.nof.org Document Released: 03/15/2003 Document Revised: 04/19/2012 Document Reviewed: 06/14/2008 ExitCare Patient Information   2015 [employer]. This information is not intended to replace advice given to you by your health care provider. Make sure you discuss any questions you have with your health care provider.  

## 2014-08-24 NOTE — Progress Notes (Addendum)
Subjective:    Patient ID: Sheila Ferrell, female    DOB: November 30, 1940, 74 y.o.   MRN: [id number]  Patient here today for chronic disease follow up.  Hyperlipidemia This is a chronic problem. The current episode started more than 1 year ago. Recent lipid tests were reviewed and are high. Pertinent negatives include no chest pain, focal sensory loss, focal weakness, myalgias or shortness of breath. Current antihyperlipidemic treatment includes statins. The current treatment provides moderate improvement of lipids. Compliance problems include adherence to diet and adherence to exercise.  Risk factors for coronary artery disease include dyslipidemia and post-menopausal.  Thyroid Problem Presents for follow-up visit. Patient reports no cold intolerance, constipation, fatigue, menstrual problem, tremors or visual change. The symptoms have been stable. The treatment provided significant relief. Her past medical history is significant for hyperlipidemia. There are no known risk factors.  GAD Stress management not currently on any antianxiety meds.  Depression Zoloft keeps her from getting upset- Husband has dementia and she is his sole caregiver. Osteorporosis Fosamax every Monday- no C/o side effects Vitamin d deficiency suppose to be taking vitamin d OTC but forgets to take daily   Review of Systems  Constitutional: Negative.  Negative for fatigue.  HENT: Negative.   Respiratory: Negative.  Negative for shortness of breath.   Cardiovascular: Negative.  Negative for chest pain.  Gastrointestinal: Negative.  Negative for constipation.  Endocrine: Negative.  Negative for cold intolerance.  Genitourinary: Negative.  Negative for menstrual problem.  Musculoskeletal: Positive for gait problem (right knee pain. ). Negative for myalgias.  Skin: Negative.   Allergic/Immunologic: Negative.   Neurological: Negative.  Negative for tremors and focal weakness.  Hematological: Negative.    Psychiatric/Behavioral: Negative.   All other systems reviewed and are negative.      Objective:   Physical Exam  Constitutional: She is oriented to person, place, and time. She appears well-developed and well-nourished.  HENT:  Head: Normocephalic.  Right Ear: Hearing, tympanic membrane, external ear and ear canal normal.  Left Ear: Hearing, tympanic membrane, external ear and ear canal normal.  Nose: Nose normal.  Mouth/Throat: Uvula is midline and oropharynx is clear and moist.  Eyes: Conjunctivae and EOM are normal. Pupils are equal, round, and reactive to light.  Neck: Trachea normal, normal range of motion and full passive range of motion without pain. Neck supple. No JVD present. Carotid bruit is not present. No thyroid mass and no thyromegaly present.  Cardiovascular: Normal rate, regular rhythm, normal heart sounds and intact distal pulses.  Exam reveals no gallop and no friction rub.   No murmur heard. Abdominal: Soft. Bowel sounds are normal. She exhibits no distension and no mass. There is no tenderness.  Genitourinary: Vagina normal and uterus normal. No breast swelling, tenderness, discharge or bleeding.  Musculoskeletal: Normal range of motion.  Lymphadenopathy:    She has no cervical adenopathy.  Neurological: She is alert and oriented to person, place, and time. She has normal reflexes.  Skin: Skin is warm and dry.  Psychiatric: She has a normal mood and affect. Her behavior is normal. Judgment and thought content normal.    BP 120/73 mmHg  Pulse 85  Temp(Src) 97.3 F (36.3 C) (Oral)  Ht 5' 1" (1.549 m)  Wt 175 lb (79.379 kg)  BMI 33.08 kg/m2  Chest x ray- no cardiopulmonary disease-Preliminary reading by Ronnald Collum, FNP  [facility]   EKG- Kerry Hough, FNP      Assessment & Plan:   1. Hypothyroidism due  to acquired atrophy of thyroid - Thyroid Panel With TSH - SYNTHROID 112 MCG tablet; Take 1 tablet (112 mcg total) by mouth daily before  breakfast.  Dispense: 90 tablet; Refill: 1  2. Hyperlipidemia LOw fat diet - CMP14+EGFR - Lipid panel - DG Chest 2 View - EKG 12-Lead - atorvastatin (LIPITOR) 20 MG tablet; TAKE 1 TABLET BY MOUTH ONCE A DAY EVERY EVENING AT 6PM  Dispense: 90 tablet; Refill: 1  3. Vitamin D deficiency  4. Osteoporosis Weight  Bearing exercises - alendronate (FOSAMAX) 70 MG tablet; TAKE 1 TABLET BY MOUTH ONCE A WEEK ON AN EMPTY STOMACH WITH A FULL GLASS OF WATER AS INSTRUCTED  Dispense: 12 tablet; Refill: 1  5. GAD (generalized anxiety disorder) Stress management  6. Depression - sertraline (ZOLOFT) 50 MG tablet; Take 1 tablet (50 mg total) by mouth daily.  Dispense: 90 tablet; Refill: 1  7. BMI 33.0-33.9,adult Discussed diet and exercise for person with BMI >25 Will recheck weight in 3-6 months     Labs pending Health maintenance reviewed Diet and exercise encouraged Continue all meds Follow up  In 3 month   [place], FNP

## 2014-08-25 LAB — CMP14+EGFR
ALT: 23 IU/L (ref 0–32)
AST: 20 IU/L (ref 0–40)
Albumin/Globulin Ratio: 1.8 (ref 1.1–2.5)
Albumin: 4.6 g/dL (ref 3.5–4.8)
Alkaline Phosphatase: 125 IU/L — ABNORMAL HIGH (ref 39–117)
BUN/Creatinine Ratio: 13 (ref 11–26)
BUN: 11 mg/dL (ref 8–27)
Bilirubin Total: 0.4 mg/dL (ref 0.0–1.2)
CO2: 29 mmol/L (ref 18–29)
Calcium: 10.2 mg/dL (ref 8.7–10.3)
Chloride: 102 mmol/L (ref 97–108)
Creatinine, Ser: 0.84 mg/dL (ref 0.57–1.00)
GFR, EST AFRICAN AMERICAN: 79 mL/min/{1.73_m2} (ref >59)
GFR, EST NON AFRICAN AMERICAN: 69 mL/min/{1.73_m2} (ref >59)
GLUCOSE: 86 mg/dL (ref 65–99)
Globulin, Total: 2.5 g/dL (ref 1.5–4.5)
Potassium: 5.9 mmol/L (ref 3.5–5.2)
Sodium: 145 mmol/L — ABNORMAL HIGH (ref 134–144)
TOTAL PROTEIN: 7.1 g/dL (ref 6.0–8.5)

## 2014-08-25 LAB — LIPID PANEL
CHOL/HDL RATIO: 2.8 ratio (ref 0.0–4.4)
CHOLESTEROL TOTAL: 138 mg/dL (ref 100–199)
HDL: 49 mg/dL (ref >39)
LDL CALC: 59 mg/dL (ref 0–99)
Triglycerides: 150 mg/dL — ABNORMAL HIGH (ref 0–149)
VLDL Cholesterol Cal: 30 mg/dL (ref 5–40)

## 2014-08-25 LAB — THYROID PANEL WITH TSH
Free Thyroxine Index: 2.7 (ref 1.2–4.9)
T3 UPTAKE RATIO: 29 % (ref 24–39)
T4 TOTAL: 9.4 ug/dL (ref 4.5–12.0)
TSH: 0.368 u[IU]/mL — AB (ref 0.450–4.500)

## 2014-08-25 NOTE — Addendum Note (Signed)
Addended by: Jamelle Haring on: 08/25/2014 08:39 AM   Modules accepted: Orders, SmartSet

## 2014-08-26 ENCOUNTER — Other Ambulatory Visit (INDEPENDENT_AMBULATORY_CARE_PROVIDER_SITE_OTHER): Payer: Medicare Other

## 2014-08-26 DIAGNOSIS — R799 Abnormal finding of blood chemistry, unspecified: Secondary | ICD-10-CM | POA: Diagnosis not present

## 2014-08-27 LAB — CMP14+EGFR
A/G RATIO: 1.9 (ref 1.1–2.5)
ALBUMIN: 4.4 g/dL (ref 3.5–4.8)
ALK PHOS: 125 IU/L — AB (ref 39–117)
ALT: 17 IU/L (ref 0–32)
AST: 16 IU/L (ref 0–40)
BILIRUBIN TOTAL: 0.3 mg/dL (ref 0.0–1.2)
BUN / CREAT RATIO: 15 (ref 11–26)
BUN: 12 mg/dL (ref 8–27)
CHLORIDE: 101 mmol/L (ref 97–108)
CO2: 25 mmol/L (ref 18–29)
CREATININE: 0.78 mg/dL (ref 0.57–1.00)
Calcium: 9.6 mg/dL (ref 8.7–10.3)
GFR calc Af Amer: 87 mL/min/{1.73_m2} (ref >59)
GFR calc non Af Amer: 75 mL/min/{1.73_m2} (ref >59)
GLOBULIN, TOTAL: 2.3 g/dL (ref 1.5–4.5)
Glucose: 101 mg/dL — ABNORMAL HIGH (ref 65–99)
POTASSIUM: 4.4 mmol/L (ref 3.5–5.2)
SODIUM: 144 mmol/L (ref 134–144)
Total Protein: 6.7 g/dL (ref 6.0–8.5)

## 2014-10-19 DIAGNOSIS — H26493 Other secondary cataract, bilateral: Secondary | ICD-10-CM | POA: Diagnosis not present

## 2014-10-19 DIAGNOSIS — H43813 Vitreous degeneration, bilateral: Secondary | ICD-10-CM | POA: Diagnosis not present

## 2014-10-19 DIAGNOSIS — H43393 Other vitreous opacities, bilateral: Secondary | ICD-10-CM | POA: Diagnosis not present

## 2014-10-19 DIAGNOSIS — Z961 Presence of intraocular lens: Secondary | ICD-10-CM | POA: Diagnosis not present

## 2014-10-23 ENCOUNTER — Other Ambulatory Visit: Payer: Self-pay | Admitting: Nurse Practitioner

## 2014-10-26 ENCOUNTER — Other Ambulatory Visit: Payer: Self-pay | Admitting: Nurse Practitioner

## 2014-11-12 ENCOUNTER — Other Ambulatory Visit: Payer: Self-pay | Admitting: Nurse Practitioner

## 2014-12-04 ENCOUNTER — Encounter: Payer: Self-pay | Admitting: Nurse Practitioner

## 2014-12-04 ENCOUNTER — Ambulatory Visit (INDEPENDENT_AMBULATORY_CARE_PROVIDER_SITE_OTHER): Payer: Medicare Other | Admitting: Nurse Practitioner

## 2014-12-04 ENCOUNTER — Ambulatory Visit (INDEPENDENT_AMBULATORY_CARE_PROVIDER_SITE_OTHER): Payer: Medicare Other

## 2014-12-04 VITALS — BP 128/82 | HR 84 | Temp 97.0°F | Ht 61.0 in | Wt 178.0 lb

## 2014-12-04 DIAGNOSIS — R51 Headache: Secondary | ICD-10-CM | POA: Diagnosis not present

## 2014-12-04 DIAGNOSIS — E038 Other specified hypothyroidism: Secondary | ICD-10-CM | POA: Diagnosis not present

## 2014-12-04 DIAGNOSIS — E559 Vitamin D deficiency, unspecified: Secondary | ICD-10-CM | POA: Diagnosis not present

## 2014-12-04 DIAGNOSIS — Z6833 Body mass index (BMI) 33.0-33.9, adult: Secondary | ICD-10-CM

## 2014-12-04 DIAGNOSIS — F411 Generalized anxiety disorder: Secondary | ICD-10-CM

## 2014-12-04 DIAGNOSIS — R519 Headache, unspecified: Secondary | ICD-10-CM

## 2014-12-04 DIAGNOSIS — M810 Age-related osteoporosis without current pathological fracture: Secondary | ICD-10-CM

## 2014-12-04 DIAGNOSIS — E785 Hyperlipidemia, unspecified: Secondary | ICD-10-CM

## 2014-12-04 DIAGNOSIS — F32A Depression, unspecified: Secondary | ICD-10-CM

## 2014-12-04 DIAGNOSIS — F329 Major depressive disorder, single episode, unspecified: Secondary | ICD-10-CM

## 2014-12-04 DIAGNOSIS — E034 Atrophy of thyroid (acquired): Secondary | ICD-10-CM

## 2014-12-04 MED ORDER — SERTRALINE HCL 50 MG PO TABS: 50.0000 mg | ORAL_TABLET | Freq: Every day | ORAL | Status: DC

## 2014-12-04 MED ORDER — ALENDRONATE SODIUM 70 MG PO TABS: ORAL_TABLET | ORAL | Status: DC

## 2014-12-04 MED ORDER — ATORVASTATIN CALCIUM 20 MG PO TABS: ORAL_TABLET | ORAL | Status: DC

## 2014-12-04 NOTE — Patient Instructions (Signed)
Bone Health Bones protect organs, store calcium, and anchor muscles. Good health habits, such as eating nutritious foods and exercising regularly, are important for maintaining healthy bones. They can also help to prevent a condition that causes bones to lose density and become weak and brittle (osteoporosis). WHY IS BONE MASS IMPORTANT? Bone mass refers to the amount of bone tissue that you have. The higher your bone mass, the stronger your bones. An important step toward having healthy bones throughout life is to have strong and dense bones during childhood. A young adult who has a high bone mass is more likely to have a high bone mass later in life. Bone mass at its greatest it is called peak bone mass. A large decline in bone mass occurs in older adults. In women, it occurs about the time of menopause. During this time, it is important to practice good health habits, because if more bone is lost than what is replaced, the bones will become less healthy and more likely to break (fracture). If you find that you have a low bone mass, you may be able to prevent osteoporosis or further bone loss by changing your diet and lifestyle. HOW CAN I FIND OUT IF MY BONE MASS IS LOW? Bone mass can be measured with an X-ray test that is called a bone mineral density (BMD) test. This test is recommended for all women who are age 65 or older. It may also be recommended for men who are age 70 or older, or for people who are more likely to develop osteoporosis due to:  Having bones that break easily.  Having a long-term disease that weakens bones, such as kidney disease or rheumatoid arthritis.  Having menopause earlier than normal.  Taking medicine that weakens bones, such as steroids, thyroid hormones, or hormone treatment for breast cancer or prostate cancer.  Smoking.  Drinking three or more alcoholic drinks each day. WHAT ARE THE NUTRITIONAL RECOMMENDATIONS FOR HEALTHY BONES? To have healthy bones, you need  to get enough of the right minerals and vitamins. Most nutrition experts recommend getting these nutrients from the foods that you eat. Nutritional recommendations vary from person to person. Ask your health care provider what is healthy for you. Here are some general guidelines. Calcium Recommendations Calcium is the most important (essential) mineral for bone health. Most people can get enough calcium from their diet, but supplements may be recommended for people who are at risk for osteoporosis. Good sources of calcium include:  Dairy products, such as low-fat or nonfat milk, cheese, and yogurt.  Dark green leafy vegetables, such as bok choy and broccoli.  Calcium-fortified foods, such as orange juice, cereal, bread, soy beverages, and tofu products.  Nuts, such as almonds. Follow these recommended amounts for daily calcium intake:  Children, age 1-3: 700 mg.  Children, age 4-8: 1,000 mg.  Children, age 9-13: 1,300 mg.  Teens, age 14-18: 1,300 mg.  Adults, age 19-50: 1,000 mg.  Adults, age 51-70:  Men: 1,000 mg.  Women: 1,200 mg.  Adults, age 71 or older: 1,200 mg.  Pregnant and breastfeeding females:  Teens: 1,300 mg.  Adults: 1,000 mg. Vitamin D Recommendations Vitamin D is the most essential vitamin for bone health. It helps the body to absorb calcium. Sunlight stimulates the skin to make vitamin D, so be sure to get enough sunlight. If you live in a cold climate or you do not get outside often, your health care provider may recommend that you take vitamin D supplements. Good   sources of vitamin D in your diet include:  Egg yolks.  Saltwater fish.  Milk and cereal fortified with vitamin D. Follow these recommended amounts for daily vitamin D intake:  Children and teens, age 1-18: 600 international units.  Adults, age 50 or younger: 400-800 international units.  Adults, age 51 or older: 800-1,000 international units. Other Nutrients Other nutrients for bone  health include:  Phosphorus. This mineral is found in meat, poultry, dairy foods, nuts, and legumes. The recommended daily intake for adult men and adult women is 700 mg.  Magnesium. This mineral is found in seeds, nuts, dark green vegetables, and legumes. The recommended daily intake for adult men is 400-420 mg. For adult women, it is 310-320 mg.  Vitamin K. This vitamin is found in green leafy vegetables. The recommended daily intake is 120 mg for adult men and 90 mg for adult women. WHAT TYPE OF PHYSICAL ACTIVITY IS BEST FOR BUILDING AND MAINTAINING HEALTHY BONES? Weight-bearing and strength-building activities are important for building and maintaining peak bone mass. Weight-bearing activities cause muscles and bones to work against gravity. Strength-building activities increases muscle strength that supports bones. Weight-bearing and muscle-building activities include:  Walking and hiking.  Jogging and running.  Dancing.  Gym exercises.  Lifting weights.  Tennis and racquetball.  Climbing stairs.  Aerobics. Adults should get at least 30 minutes of moderate physical activity on most days. Children should get at least 60 minutes of moderate physical activity on most days. Ask your health care provide what type of exercise is best for you. WHERE CAN I FIND MORE INFORMATION? For more information, check out the following websites:  National Osteoporosis Foundation: http://nof.org/learn/basics  National Institutes of Health: http://www.niams.nih.gov/Health_Info/Bone/Bone_Health/bone_health_for_life.asp   This information is not intended to replace advice given to you by your health care provider. Make sure you discuss any questions you have with your health care provider.   Document Released: 03/15/2003 Document Revised: 05/09/2014 Document Reviewed: 12/28/2013 Elsevier Interactive Patient Education 2016 [employer] Inc.  

## 2014-12-04 NOTE — Progress Notes (Signed)
Subjective:    Patient ID: Sheila Ferrell, female    DOB: March 18, 1940, 74 y.o.   MRN: [id number]  Patient here today for chronic disease follow up.  *c/o frequent headaches that start in neck and go up- having at least 2-3x a week- rates pain 8/10- last about 5-10 minutes- goes away on its own- never takes anything. Usually occurs when she is laying down.  Hyperlipidemia This is a chronic problem. The current episode started more than 1 year ago. Recent lipid tests were reviewed and are high. Pertinent negatives include no chest pain, focal sensory loss, focal weakness, myalgias or shortness of breath. Current antihyperlipidemic treatment includes statins. The current treatment provides moderate improvement of lipids. Compliance problems include adherence to diet and adherence to exercise.  Risk factors for coronary artery disease include dyslipidemia and post-menopausal.  Thyroid Problem Presents for follow-up visit. Patient reports no cold intolerance, constipation, fatigue, menstrual problem, tremors or visual change. The symptoms have been stable. The treatment provided significant relief. Her past medical history is significant for hyperlipidemia. There are no known risk factors.  GAD Stress management not currently on any antianxiety meds.  Depression Zoloft keeps her from getting upset- Husband has dementia and she is his sole caregiver. Osteorporosis Fosamax every Monday- no C/o side effects Vitamin d deficiency suppose to be taking vitamin d OTC but forgets to take daily   Review of Systems  Constitutional: Negative.  Negative for fatigue.  HENT: Negative.   Respiratory: Negative.  Negative for shortness of breath.   Cardiovascular: Negative.  Negative for chest pain.  Gastrointestinal: Negative.  Negative for constipation.  Endocrine: Negative.  Negative for cold intolerance.  Genitourinary: Negative.  Negative for menstrual problem.  Musculoskeletal: Positive for gait problem  (right knee pain. ). Negative for myalgias.  Skin: Negative.   Allergic/Immunologic: Negative.   Neurological: Negative.  Negative for tremors and focal weakness.  Hematological: Negative.   Psychiatric/Behavioral: Negative.   All other systems reviewed and are negative.      Objective:   Physical Exam  Constitutional: She is oriented to person, place, and time. She appears well-developed and well-nourished.  HENT:  Head: Normocephalic.  Right Ear: Hearing, tympanic membrane, external ear and ear canal normal.  Left Ear: Hearing, tympanic membrane, external ear and ear canal normal.  Nose: Nose normal.  Mouth/Throat: Uvula is midline and oropharynx is clear and moist.  Eyes: Conjunctivae and EOM are normal. Pupils are equal, round, and reactive to light.  Neck: Trachea normal, normal range of motion and full passive range of motion without pain. Neck supple. No JVD present. Carotid bruit is not present. No thyroid mass and no thyromegaly present.  Cardiovascular: Normal rate, regular rhythm, normal heart sounds and intact distal pulses.  Exam reveals no gallop and no friction rub.   No murmur heard. Abdominal: Soft. Bowel sounds are normal. She exhibits no distension and no mass. There is no tenderness.  Genitourinary: Vagina normal and uterus normal. No breast swelling, tenderness, discharge or bleeding.  Musculoskeletal: Normal range of motion.  Lymphadenopathy:    She has no cervical adenopathy.  Neurological: She is alert and oriented to person, place, and time. She has normal reflexes.  Skin: Skin is warm and dry.  Psychiatric: She has a normal mood and affect. Her behavior is normal. Judgment and thought content normal.   BP 128/82 mmHg  Pulse 84  Temp(Src) 97 F (36.1 C) (Oral)  Ht _0  (1.549 m)  Wt 178 lb (80.74  kg)  BMI 33.65 kg/m2       Assessment & Plan:   1. Hypothyroidism due to acquired atrophy of thyroid - Thyroid Panel With TSH  2.  Osteoporosis Weight bearing exercises - alendronate (FOSAMAX) 70 MG tablet; TAKE 1 TABLET BY MOUTH ONCE A WEEK ON AN EMPTY STOMACH WITH A FULL GLA SS OF WATER AS INSTRUCTED  Dispense: 12 tablet; Refill: 5  3. GAD (generalized anxiety disorder) Stress management - sertraline (ZOLOFT) 50 MG tablet; Take 1 tablet (50 mg total) by mouth daily.  Dispense: 90 tablet; Refill: 1  4. Depression  5. Hyperlipidemia Low fta diet - atorvastatin (LIPITOR) 20 MG tablet; TAKE 1 TABLET BY MOUTH ONCE A DAY EVERY EVENING AT 6PM  Dispense: 90 tablet; Refill: 1 - CMP14+EGFR - Lipid panel  6. Vitamin D deficiency  7. BMI 33.0-33.9,adult Discussed diet and exercise for person with BMI >25 Will recheck weight in 3-6 months   8. Nonintractable episodic headache, unspecified headache type Keep diary of episodes- when , how long, intensity, and what doing at time of ocurence    Labs pending Health maintenance reviewed Diet and exercise encouraged Continue all meds Follow up  In 6 months   [place], FNP

## 2014-12-05 LAB — LIPID PANEL
Chol/HDL Ratio: 2.6 ratio units (ref 0.0–4.4)
Cholesterol, Total: 137 mg/dL (ref 100–199)
HDL: 53 mg/dL (ref >39)
LDL CALC: 57 mg/dL (ref 0–99)
TRIGLYCERIDES: 135 mg/dL (ref 0–149)
VLDL CHOLESTEROL CAL: 27 mg/dL (ref 5–40)

## 2014-12-05 LAB — CMP14+EGFR
ALBUMIN: 4.5 g/dL (ref 3.5–4.8)
ALT: 25 IU/L (ref 0–32)
AST: 34 IU/L (ref 0–40)
Albumin/Globulin Ratio: 1.6 (ref 1.1–2.5)
Alkaline Phosphatase: 121 IU/L — ABNORMAL HIGH (ref 39–117)
BUN/Creatinine Ratio: 13 (ref 11–26)
BUN: 11 mg/dL (ref 8–27)
Bilirubin Total: 0.4 mg/dL (ref 0.0–1.2)
CALCIUM: 9.6 mg/dL (ref 8.7–10.3)
CO2: 26 mmol/L (ref 18–29)
CREATININE: 0.82 mg/dL (ref 0.57–1.00)
Chloride: 100 mmol/L (ref 97–106)
GFR, EST AFRICAN AMERICAN: 82 mL/min/{1.73_m2} (ref >59)
GFR, EST NON AFRICAN AMERICAN: 71 mL/min/{1.73_m2} (ref >59)
GLOBULIN, TOTAL: 2.9 g/dL (ref 1.5–4.5)
Glucose: 99 mg/dL (ref 65–99)
Potassium: 5.4 mmol/L — ABNORMAL HIGH (ref 3.5–5.2)
SODIUM: 141 mmol/L (ref 136–144)
TOTAL PROTEIN: 7.4 g/dL (ref 6.0–8.5)

## 2014-12-05 LAB — THYROID PANEL WITH TSH
Free Thyroxine Index: 2.4 (ref 1.2–4.9)
T3 Uptake Ratio: 28 % (ref 24–39)
T4 TOTAL: 8.5 ug/dL (ref 4.5–12.0)
TSH: 0.81 u[IU]/mL (ref 0.450–4.500)

## 2015-01-07 HISTORY — PX: EYE SURGERY: SHX253

## 2015-03-07 ENCOUNTER — Other Ambulatory Visit: Payer: Self-pay

## 2015-03-07 MED ORDER — SYNTHROID 112 MCG PO TABS: ORAL_TABLET | ORAL | Status: DC

## 2015-03-12 ENCOUNTER — Telehealth: Payer: Self-pay | Admitting: Nurse Practitioner

## 2015-03-19 NOTE — Telephone Encounter (Signed)
Patient went ahead and got her medication from [facility].

## 2015-04-05 ENCOUNTER — Encounter: Payer: Self-pay | Admitting: *Deleted

## 2015-06-05 ENCOUNTER — Ambulatory Visit: Payer: Medicare Other | Admitting: Nurse Practitioner

## 2015-06-08 ENCOUNTER — Ambulatory Visit (INDEPENDENT_AMBULATORY_CARE_PROVIDER_SITE_OTHER): Payer: Medicare Other | Admitting: Nurse Practitioner

## 2015-06-08 ENCOUNTER — Other Ambulatory Visit: Payer: Self-pay | Admitting: Nurse Practitioner

## 2015-06-08 ENCOUNTER — Encounter: Payer: Self-pay | Admitting: Nurse Practitioner

## 2015-06-08 VITALS — BP 135/74 | HR 78 | Temp 97.1°F | Ht 61.0 in | Wt 179.0 lb

## 2015-06-08 DIAGNOSIS — Z1212 Encounter for screening for malignant neoplasm of rectum: Secondary | ICD-10-CM | POA: Diagnosis not present

## 2015-06-08 DIAGNOSIS — E559 Vitamin D deficiency, unspecified: Secondary | ICD-10-CM | POA: Diagnosis not present

## 2015-06-08 DIAGNOSIS — F32A Depression, unspecified: Secondary | ICD-10-CM

## 2015-06-08 DIAGNOSIS — E034 Atrophy of thyroid (acquired): Secondary | ICD-10-CM | POA: Diagnosis not present

## 2015-06-08 DIAGNOSIS — Z6833 Body mass index (BMI) 33.0-33.9, adult: Secondary | ICD-10-CM | POA: Diagnosis not present

## 2015-06-08 DIAGNOSIS — E038 Other specified hypothyroidism: Secondary | ICD-10-CM

## 2015-06-08 DIAGNOSIS — F329 Major depressive disorder, single episode, unspecified: Secondary | ICD-10-CM

## 2015-06-08 DIAGNOSIS — F411 Generalized anxiety disorder: Secondary | ICD-10-CM

## 2015-06-08 DIAGNOSIS — E785 Hyperlipidemia, unspecified: Secondary | ICD-10-CM

## 2015-06-08 DIAGNOSIS — M810 Age-related osteoporosis without current pathological fracture: Secondary | ICD-10-CM | POA: Diagnosis not present

## 2015-06-08 DIAGNOSIS — Z9289 Personal history of other medical treatment: Secondary | ICD-10-CM | POA: Diagnosis not present

## 2015-06-08 MED ORDER — SERTRALINE HCL 50 MG PO TABS: 50.0000 mg | ORAL_TABLET | Freq: Every day | ORAL | Status: DC

## 2015-06-08 MED ORDER — LEVOTHYROXINE SODIUM 112 MCG PO TABS: ORAL_TABLET | ORAL | Status: DC

## 2015-06-08 MED ORDER — SYNTHROID 112 MCG PO TABS: ORAL_TABLET | ORAL | Status: DC

## 2015-06-08 MED ORDER — ALENDRONATE SODIUM 70 MG PO TABS: ORAL_TABLET | ORAL | Status: DC

## 2015-06-08 MED ORDER — ATORVASTATIN CALCIUM 20 MG PO TABS: ORAL_TABLET | ORAL | Status: DC

## 2015-06-08 NOTE — Patient Instructions (Signed)
Bone Health Bones protect organs, store calcium, and anchor muscles. Good health habits, such as eating nutritious foods and exercising regularly, are important for maintaining healthy bones. They can also help to prevent a condition that causes bones to lose density and become weak and brittle (osteoporosis). WHY IS BONE MASS IMPORTANT? Bone mass refers to the amount of bone tissue that you have. The higher your bone mass, the stronger your bones. An important step toward having healthy bones throughout life is to have strong and dense bones during childhood. A young adult who has a high bone mass is more likely to have a high bone mass later in life. Bone mass at its greatest it is called peak bone mass. A large decline in bone mass occurs in older adults. In women, it occurs about the time of menopause. During this time, it is important to practice good health habits, because if more bone is lost than what is replaced, the bones will become less healthy and more likely to break (fracture). If you find that you have a low bone mass, you may be able to prevent osteoporosis or further bone loss by changing your diet and lifestyle. HOW CAN I FIND OUT IF MY BONE MASS IS LOW? Bone mass can be measured with an X-ray test that is called a bone mineral density (BMD) test. This test is recommended for all women who are age 65 or older. It may also be recommended for men who are age 70 or older, or for people who are more likely to develop osteoporosis due to:  Having bones that break easily.  Having a long-term disease that weakens bones, such as kidney disease or rheumatoid arthritis.  Having menopause earlier than normal.  Taking medicine that weakens bones, such as steroids, thyroid hormones, or hormone treatment for breast cancer or prostate cancer.  Smoking.  Drinking three or more alcoholic drinks each day. WHAT ARE THE NUTRITIONAL RECOMMENDATIONS FOR HEALTHY BONES? To have healthy bones, you need  to get enough of the right minerals and vitamins. Most nutrition experts recommend getting these nutrients from the foods that you eat. Nutritional recommendations vary from person to person. Ask your health care provider what is healthy for you. Here are some general guidelines. Calcium Recommendations Calcium is the most important (essential) mineral for bone health. Most people can get enough calcium from their diet, but supplements may be recommended for people who are at risk for osteoporosis. Good sources of calcium include:  Dairy products, such as low-fat or nonfat milk, cheese, and yogurt.  Dark green leafy vegetables, such as bok choy and broccoli.  Calcium-fortified foods, such as orange juice, cereal, bread, soy beverages, and tofu products.  Nuts, such as almonds. Follow these recommended amounts for daily calcium intake:  Children, age 1-3: 700 mg.  Children, age 4-8: 1,000 mg.  Children, age 9-13: 1,300 mg.  Teens, age 14-18: 1,300 mg.  Adults, age 19-50: 1,000 mg.  Adults, age 51-70:  Men: 1,000 mg.  Women: 1,200 mg.  Adults, age 71 or older: 1,200 mg.  Pregnant and breastfeeding females:  Teens: 1,300 mg.  Adults: 1,000 mg. Vitamin D Recommendations Vitamin D is the most essential vitamin for bone health. It helps the body to absorb calcium. Sunlight stimulates the skin to make vitamin D, so be sure to get enough sunlight. If you live in a cold climate or you do not get outside often, your health care provider may recommend that you take vitamin D supplements. Good   sources of vitamin D in your diet include:  Egg yolks.  Saltwater fish.  Milk and cereal fortified with vitamin D. Follow these recommended amounts for daily vitamin D intake:  Children and teens, age 1-18: 600 international units.  Adults, age 50 or younger: 400-800 international units.  Adults, age 51 or older: 800-1,000 international units. Other Nutrients Other nutrients for bone  health include:  Phosphorus. This mineral is found in meat, poultry, dairy foods, nuts, and legumes. The recommended daily intake for adult men and adult women is 700 mg.  Magnesium. This mineral is found in seeds, nuts, dark green vegetables, and legumes. The recommended daily intake for adult men is 400-420 mg. For adult women, it is 310-320 mg.  Vitamin K. This vitamin is found in green leafy vegetables. The recommended daily intake is 120 mg for adult men and 90 mg for adult women. WHAT TYPE OF PHYSICAL ACTIVITY IS BEST FOR BUILDING AND MAINTAINING HEALTHY BONES? Weight-bearing and strength-building activities are important for building and maintaining peak bone mass. Weight-bearing activities cause muscles and bones to work against gravity. Strength-building activities increases muscle strength that supports bones. Weight-bearing and muscle-building activities include:  Walking and hiking.  Jogging and running.  Dancing.  Gym exercises.  Lifting weights.  Tennis and racquetball.  Climbing stairs.  Aerobics. Adults should get at least 30 minutes of moderate physical activity on most days. Children should get at least 60 minutes of moderate physical activity on most days. Ask your health care provide what type of exercise is best for you. WHERE CAN I FIND MORE INFORMATION? For more information, check out the following websites:  National Osteoporosis Foundation: http://nof.org/learn/basics  National Institutes of Health: http://www.niams.nih.gov/Health_Info/Bone/Bone_Health/bone_health_for_life.asp   This information is not intended to replace advice given to you by your health care provider. Make sure you discuss any questions you have with your health care provider.   Document Released: 03/15/2003 Document Revised: 05/09/2014 Document Reviewed: 12/28/2013 Elsevier Interactive Patient Education 2016 [employer] Inc.  

## 2015-06-08 NOTE — Progress Notes (Signed)
Subjective:    Patient ID: Sheila Ferrell, female    DOB: 15-Mar-1940, 75 y.o.   MRN: [id number]  Patient here today for chronic disease follow up.  Outpatient Encounter Prescriptions as of 06/08/2015  Medication Sig  . alendronate (FOSAMAX) 70 MG tablet TAKE 1 TABLET BY MOUTH ONCE A WEEK ON AN EMPTY STOMACH WITH A FULL GLA SS OF WATER AS INSTRUCTED  . atorvastatin (LIPITOR) 20 MG tablet TAKE 1 TABLET BY MOUTH ONCE A DAY EVERY EVENING AT 6PM  . Cholecalciferol (VITAMIN D-3) 1000 UNITS CAPS Take by mouth daily.  . COCONUT OIL PO Take by mouth 2 (two) times daily.  . sertraline (ZOLOFT) 50 MG tablet Take 1 tablet (50 mg total) by mouth daily.  Marland Kitchen SYNTHROID 112 MCG tablet TAKE ONE TABLET BY MOUTH ONE TIME DAILY BEFORE BREAKFAST   No facility-administered encounter medications on file as of 06/08/2015.     Hyperlipidemia This is a chronic problem. The current episode started more than 1 year ago. Recent lipid tests were reviewed and are high. Pertinent negatives include no chest pain, focal sensory loss, focal weakness, myalgias or shortness of breath. Current antihyperlipidemic treatment includes statins. The current treatment provides moderate improvement of lipids. Compliance problems include adherence to diet and adherence to exercise.  Risk factors for coronary artery disease include dyslipidemia and post-menopausal.  Thyroid Problem Presents for follow-up visit. Patient reports no cold intolerance, constipation, fatigue, menstrual problem, tremors or visual change. The symptoms have been stable. The treatment provided significant relief. Her past medical history is significant for hyperlipidemia. There are no known risk factors.  GAD Stress management not currently on any antianxiety meds.  Depression Zoloft keeps her from getting upset- Husband has dementia and she is his sole caregiver. Osteorporosis Fosamax every Monday- no C/o side effects Vitamin d deficiency suppose to be taking  vitamin d OTC but forgets to take daily   Review of Systems  Constitutional: Negative.  Negative for fatigue.  HENT: Negative.   Respiratory: Negative.  Negative for shortness of breath.   Cardiovascular: Negative.  Negative for chest pain.  Gastrointestinal: Negative.  Negative for constipation.  Endocrine: Negative.  Negative for cold intolerance.  Genitourinary: Negative.  Negative for menstrual problem.  Musculoskeletal: Positive for gait problem (right knee pain. ). Negative for myalgias.  Skin: Negative.   Allergic/Immunologic: Negative.   Neurological: Negative.  Negative for tremors and focal weakness.  Hematological: Negative.   Psychiatric/Behavioral: Negative.   All other systems reviewed and are negative.      Objective:   Physical Exam  Constitutional: She is oriented to person, place, and time. She appears well-developed and well-nourished.  HENT:  Head: Normocephalic.  Right Ear: Hearing, tympanic membrane, external ear and ear canal normal.  Left Ear: Hearing, tympanic membrane, external ear and ear canal normal.  Nose: Nose normal.  Mouth/Throat: Uvula is midline and oropharynx is clear and moist.  Eyes: Conjunctivae and EOM are normal. Pupils are equal, round, and reactive to light.  Neck: Trachea normal, normal range of motion and full passive range of motion without pain. Neck supple. No JVD present. Carotid bruit is not present. No thyroid mass and no thyromegaly present.  Cardiovascular: Normal rate, regular rhythm, normal heart sounds and intact distal pulses.  Exam reveals no gallop and no friction rub.   No murmur heard. Abdominal: Soft. Bowel sounds are normal. She exhibits no distension and no mass. There is no tenderness.  Genitourinary: Vagina normal and uterus normal. No breast  swelling, tenderness, discharge or bleeding.  Musculoskeletal: Normal range of motion.  Lymphadenopathy:    She has no cervical adenopathy.  Neurological: She is alert and  oriented to person, place, and time. She has normal reflexes.  Skin: Skin is warm and dry.  Psychiatric: She has a normal mood and affect. Her behavior is normal. Judgment and thought content normal.   BP 135/74 mmHg  Pulse 78  Temp(Src) 97.1 F (36.2 C) (Oral)  Ht _0  (1.549 m)  Wt 179 lb (81.194 kg)  BMI 33.84 kg/m2       Assessment & Plan:  1. Hypothyroidism due to acquired atrophy of thyroid - Thyroid Panel With TSH - levothyroxine (SYNTHROID) 112 MCG tablet; TAKE ONE TABLET BY MOUTH ONE TIME DAILY BEFORE BREAKFAST  Dispense: 30 tablet; Refill: 2  2. Osteoporosis Weight bearing exercises - alendronate (FOSAMAX) 70 MG tablet; TAKE 1 TABLET BY MOUTH ONCE A WEEK ON AN EMPTY STOMACH WITH A FULL GLA SS OF WATER AS INSTRUCTED  Dispense: 12 tablet; Refill: 5  3. GAD (generalized anxiety disorder) Stress management - sertraline (ZOLOFT) 50 MG tablet; Take 1 tablet (50 mg total) by mouth daily.  Dispense: 90 tablet; Refill: 1  4. Depression  5. Hyperlipidemia Low fat diet - atorvastatin (LIPITOR) 20 MG tablet; TAKE 1 TABLET BY MOUTH ONCE A DAY EVERY EVENING AT 6PM  Dispense: 90 tablet; Refill: 1 - CMP14+EGFR - Lipid panel  6. Vitamin D deficiency - VITAMIN D 25 Hydroxy (Vit-D Deficiency, Fractures)  7. BMI 33.0-33.9,adult Discussed diet and exercise for person with BMI >25 Will recheck weight in 3-6 months   8. H/O mammogram - MM Digital Screening; Future  9. Screening for malignant neoplasm of the rectum - Fecal occult blood, imunochemical; Future    Labs pending Health maintenance reviewed Diet and exercise encouraged Continue all meds Follow up  In 6 month   [place], FNP

## 2015-06-09 LAB — CMP14+EGFR
ALK PHOS: 122 IU/L — AB (ref 39–117)
ALT: 18 IU/L (ref 0–32)
AST: 18 IU/L (ref 0–40)
Albumin/Globulin Ratio: 1.7 (ref 1.2–2.2)
Albumin: 4.3 g/dL (ref 3.5–4.8)
BILIRUBIN TOTAL: 0.4 mg/dL (ref 0.0–1.2)
BUN/Creatinine Ratio: 11 — ABNORMAL LOW (ref 12–28)
BUN: 9 mg/dL (ref 8–27)
CHLORIDE: 101 mmol/L (ref 96–106)
CO2: 28 mmol/L (ref 18–29)
Calcium: 9.4 mg/dL (ref 8.7–10.3)
Creatinine, Ser: 0.8 mg/dL (ref 0.57–1.00)
GFR calc non Af Amer: 72 mL/min/{1.73_m2} (ref >59)
GFR, EST AFRICAN AMERICAN: 83 mL/min/{1.73_m2} (ref >59)
GLUCOSE: 96 mg/dL (ref 65–99)
Globulin, Total: 2.5 g/dL (ref 1.5–4.5)
Potassium: 4.6 mmol/L (ref 3.5–5.2)
Sodium: 144 mmol/L (ref 134–144)
TOTAL PROTEIN: 6.8 g/dL (ref 6.0–8.5)

## 2015-06-09 LAB — LIPID PANEL
CHOL/HDL RATIO: 2.5 ratio (ref 0.0–4.4)
CHOLESTEROL TOTAL: 113 mg/dL (ref 100–199)
HDL: 46 mg/dL (ref >39)
LDL Calculated: 45 mg/dL (ref 0–99)
Triglycerides: 108 mg/dL (ref 0–149)
VLDL Cholesterol Cal: 22 mg/dL (ref 5–40)

## 2015-06-09 LAB — VITAMIN D 25 HYDROXY (VIT D DEFICIENCY, FRACTURES): VIT D 25 HYDROXY: 51 ng/mL (ref 30.0–100.0)

## 2015-06-09 LAB — THYROID PANEL WITH TSH
FREE THYROXINE INDEX: 2.3 (ref 1.2–4.9)
T3 UPTAKE RATIO: 30 % (ref 24–39)
T4 TOTAL: 7.8 ug/dL (ref 4.5–12.0)
TSH: 1.26 u[IU]/mL (ref 0.450–4.500)

## 2015-06-14 DIAGNOSIS — H26493 Other secondary cataract, bilateral: Secondary | ICD-10-CM | POA: Diagnosis not present

## 2015-06-14 DIAGNOSIS — H43393 Other vitreous opacities, bilateral: Secondary | ICD-10-CM | POA: Diagnosis not present

## 2015-06-14 DIAGNOSIS — H43813 Vitreous degeneration, bilateral: Secondary | ICD-10-CM | POA: Diagnosis not present

## 2015-06-14 DIAGNOSIS — Z961 Presence of intraocular lens: Secondary | ICD-10-CM | POA: Diagnosis not present

## 2015-08-29 ENCOUNTER — Encounter: Payer: Medicare Other | Admitting: *Deleted

## 2015-12-10 ENCOUNTER — Ambulatory Visit (INDEPENDENT_AMBULATORY_CARE_PROVIDER_SITE_OTHER): Payer: Medicare Other | Admitting: Nurse Practitioner

## 2015-12-10 ENCOUNTER — Ambulatory Visit (INDEPENDENT_AMBULATORY_CARE_PROVIDER_SITE_OTHER): Payer: Medicare Other

## 2015-12-10 ENCOUNTER — Other Ambulatory Visit: Payer: Self-pay | Admitting: Nurse Practitioner

## 2015-12-10 ENCOUNTER — Encounter: Payer: Self-pay | Admitting: Nurse Practitioner

## 2015-12-10 VITALS — BP 116/78 | HR 99 | Temp 97.0°F | Ht 61.0 in | Wt 178.0 lb

## 2015-12-10 DIAGNOSIS — E782 Mixed hyperlipidemia: Secondary | ICD-10-CM

## 2015-12-10 DIAGNOSIS — E034 Atrophy of thyroid (acquired): Secondary | ICD-10-CM

## 2015-12-10 DIAGNOSIS — F3342 Major depressive disorder, recurrent, in full remission: Secondary | ICD-10-CM | POA: Diagnosis not present

## 2015-12-10 DIAGNOSIS — F411 Generalized anxiety disorder: Secondary | ICD-10-CM

## 2015-12-10 DIAGNOSIS — E559 Vitamin D deficiency, unspecified: Secondary | ICD-10-CM | POA: Diagnosis not present

## 2015-12-10 DIAGNOSIS — M810 Age-related osteoporosis without current pathological fracture: Secondary | ICD-10-CM

## 2015-12-10 DIAGNOSIS — E785 Hyperlipidemia, unspecified: Secondary | ICD-10-CM

## 2015-12-10 DIAGNOSIS — Z6833 Body mass index (BMI) 33.0-33.9, adult: Secondary | ICD-10-CM

## 2015-12-10 DIAGNOSIS — R079 Chest pain, unspecified: Secondary | ICD-10-CM

## 2015-12-10 MED ORDER — SERTRALINE HCL 50 MG PO TABS: 50.0000 mg | ORAL_TABLET | Freq: Every day | ORAL | 1 refills | Status: DC

## 2015-12-10 MED ORDER — ALENDRONATE SODIUM 70 MG PO TABS: ORAL_TABLET | ORAL | 5 refills | Status: DC

## 2015-12-10 MED ORDER — ATORVASTATIN CALCIUM 20 MG PO TABS: ORAL_TABLET | ORAL | 1 refills | Status: DC

## 2015-12-10 MED ORDER — NITROGLYCERIN 0.4 MG SL SUBL: 0.4000 mg | SUBLINGUAL_TABLET | SUBLINGUAL | 3 refills | Status: DC | PRN

## 2015-12-10 MED ORDER — LEVOTHYROXINE SODIUM 112 MCG PO TABS: ORAL_TABLET | ORAL | 2 refills | Status: DC

## 2015-12-10 MED ORDER — SERTRALINE HCL 100 MG PO TABS
100.0000 mg | ORAL_TABLET | Freq: Every day | ORAL | 1 refills | Status: DC
Start: 2015-12-10 — End: 2016-05-23

## 2015-12-10 NOTE — Progress Notes (Signed)
Subjective:    Patient ID: Sheila Ferrell, female    DOB: 05-Jul-1940, 75 y.o.   MRN: [id number]  Patient here today for chronic disease follow up. No changes since last visit-   Outpatient Encounter Prescriptions as of 12/10/2015  Medication Sig  . alendronate (FOSAMAX) 70 MG tablet TAKE 1 TABLET BY MOUTH ONCE A WEEK ON AN EMPTY STOMACH WITH A FULL GLA SS OF WATER AS INSTRUCTED  . atorvastatin (LIPITOR) 20 MG tablet TAKE 1 TABLET BY MOUTH ONCE A DAY EVERY EVENING AT 6PM  . Cholecalciferol (VITAMIN D-3) 1000 UNITS CAPS Take by mouth daily.  . COCONUT OIL PO Take by mouth 2 (two) times daily.  Marland Kitchen levothyroxine (SYNTHROID) 112 MCG tablet TAKE ONE TABLET BY MOUTH ONE TIME DAILY BEFORE BREAKFAST  . sertraline (ZOLOFT) 50 MG tablet Take 1 tablet (50 mg total) by mouth daily.   No facility-administered encounter medications on file as of 12/10/2015.    * Patient c/o left chest wall pain that radiates down her arm at times- had a couple of episodes last week- they last all day- denies SOB- describes as a dull ache- rates 6/10.  Hyperlipidemia  This is a chronic problem. The current episode started more than 1 year ago. Recent lipid tests were reviewed and are high. Associated symptoms include chest pain. Pertinent negatives include no focal sensory loss, focal weakness, myalgias or shortness of breath. Current antihyperlipidemic treatment includes statins. The current treatment provides moderate improvement of lipids. Compliance problems include adherence to diet and adherence to exercise.  Risk factors for coronary artery disease include dyslipidemia and post-menopausal.  Thyroid Problem  Presents for follow-up visit. Patient reports no cold intolerance, constipation, fatigue, menstrual problem, palpitations, tremors or visual change. The symptoms have been stable. The treatment provided significant relief. Her past medical history is significant for hyperlipidemia. There are no known risk factors.   GAD Stress management not currently on any antianxiety meds.  Depression Zoloft not seeming to work anymore- she is under a lot of stress taking care of her husband- she says that she seems to get hateful with her husband and she does not mean to. Cannot control her emotions. Depression screen [facility] 2/9 12/10/2015 06/08/2015 12/04/2014 08/24/2014 05/18/2014  Decreased Interest 2 0 0 1 0  Down, Depressed, Hopeless 2 1 0 0 0  PHQ - 2 Score 4 1 0 1 0  Altered sleeping 2 - - - -  Tired, decreased energy 3 - - - -  Change in appetite 3 - - - -  Feeling bad or failure about yourself  2 - - - -  Trouble concentrating 0 - - - -  Moving slowly or fidgety/restless 0 - - - -  Suicidal thoughts 2 - - - -  PHQ-9 Score 16 - - - -    Osteorporosis Fosamax every Monday- no C/o side effects Vitamin d deficiency suppose to be taking vitamin d OTC but forgets to take daily   Review of Systems  Constitutional: Negative.  Negative for fatigue.  HENT: Negative.   Respiratory: Negative.  Negative for shortness of breath.   Cardiovascular: Positive for chest pain. Negative for palpitations and leg swelling.  Gastrointestinal: Negative.  Negative for constipation.  Endocrine: Negative.  Negative for cold intolerance.  Genitourinary: Negative.  Negative for menstrual problem.  Musculoskeletal: Positive for gait problem (right knee pain. ). Negative for myalgias.  Skin: Negative.   Allergic/Immunologic: Negative.   Neurological: Negative for tremors and focal weakness.  Hematological: Negative.   Psychiatric/Behavioral: Negative.   All other systems reviewed and are negative.      Objective:   Physical Exam  Constitutional: She is oriented to person, place, and time. She appears well-developed and well-nourished.  HENT:  Head: Normocephalic.  Right Ear: Hearing, tympanic membrane, external ear and ear canal normal.  Left Ear: Hearing, tympanic membrane, external ear and ear canal normal.  Nose: Nose  normal.  Mouth/Throat: Uvula is midline and oropharynx is clear and moist.  Eyes: Conjunctivae and EOM are normal. Pupils are equal, round, and reactive to light.  Neck: Trachea normal, normal range of motion and full passive range of motion without pain. Neck supple. No JVD present. Carotid bruit is not present. No thyroid mass and no thyromegaly present.  Cardiovascular: Normal rate, regular rhythm, normal heart sounds and intact distal pulses.  Exam reveals no gallop and no friction rub.   No murmur heard. Abdominal: Soft. Bowel sounds are normal. She exhibits no distension and no mass. There is no tenderness.  Genitourinary: Vagina normal and uterus normal. No breast swelling, tenderness, discharge or bleeding.  Musculoskeletal: Normal range of motion.  Lymphadenopathy:    She has no cervical adenopathy.  Neurological: She is alert and oriented to person, place, and time. She has normal reflexes.  Skin: Skin is warm and dry.  Psychiatric: She has a normal mood and affect. Her behavior is normal. Judgment and thought content normal.    BP 116/78   Pulse 99   Temp 97 F (36.1 C) (Oral)   Ht '5\' 1"'  (1.549 m)   Wt 178 lb (80.7 kg)   BMI 33.63 kg/m   EKG- old Vance Peper, FNP  Chest x ray- n acute findings- athersclerosis of vessels-Preliminary reading by Ronnald Collum, FNP  [facility]     Assessment & Plan:  1. Hypothyroidism due to acquired atrophy of thyroid - levothyroxine (SYNTHROID) 112 MCG tablet; TAKE ONE TABLET BY MOUTH ONE TIME DAILY BEFORE BREAKFAST  Dispense: 30 tablet; Refill: 2 - Thyroid Panel With TSH  2. Age-related osteoporosis without current pathological fracture Weight bearing exercises - alendronate (FOSAMAX) 70 MG tablet; TAKE 1 TABLET BY MOUTH ONCE A WEEK ON AN EMPTY STOMACH WITH A FULL GLA SS OF WATER AS INSTRUCTED  Dispense: 12 tablet; Refill: 5  3. BMI 33.0-33.9,adult Discussed diet and exercise for person with BMI >25 Will recheck weight in  3-6 months  4. Recurrent major depressive disorder, in full remission ([place]) Increase zoloft from 3m to 1068mdaily - EKG 12-Lead - DG Chest 2 View; Future - sertraline (ZOLOFT) 100 MG tablet; Take 1 tablet (100 mg total) by mouth daily.  Dispense: 90 tablet; Refill: 1  5. GAD (generalized anxiety disorder) Stress management  6. Mixed hyperlipidemia Low fat diet - atorvastatin (LIPITOR) 20 MG tablet; TAKE 1 TABLET BY MOUTH ONCE A DAY EVERY EVENING AT 6PM  Dispense: 90 tablet; Refill: 1 - CMP14+EGFR - Lipid panel  7. Vitamin D deficiency  8. Chest pain, unspecified type No strenuous activity - EKG 12-Lead - DG Chest 2 View; Future - Ambulatory referral to Cardiology - nitroGLYCERIN (NITROSTAT) 0.4 MG SL tablet; Place 1 tablet (0.4 mg total) under the tongue every 5 (five) minutes as needed for chest pain.  Dispense: 50 tablet; Refill: 3    Labs pending Health maintenance reviewed Diet and exercise encouraged Continue all meds Follow up  In 3 months   MaGarlandFNP

## 2015-12-10 NOTE — Addendum Note (Signed)
Addended by: [name] on: 12/10/2015 11:02 AM   Modules accepted: Orders

## 2015-12-10 NOTE — Patient Instructions (Signed)
Angina Pectoris Angina pectoris is a very bad feeling in the chest, neck, or arm. Your doctor may call it angina. There are four types of angina. Angina is caused by a lack of blood in the middle and thickest layer of the heart wall (myocardium). Angina may feel like a crushing or squeezing pain in the chest. It may feel like tightness or heavy pressure in the chest. Some people say it feels like gas, heartburn, or indigestion. Some people have symptoms other than pain. These include:  Shortness of breath.  Cold sweats.  Feeling sick to your stomach (nausea).  Feeling light-headed.  Many women have chest discomfort and some of the other symptoms. However, women often have different symptoms, such as:  Feeling tired (fatigue).  Feeling nervous for no reason.  Feeling weak for no reason.  Dizziness or fainting.  Women may have angina without any symptoms. Follow these instructions at home:  Take medicines only as told by your doctor.  Take care of other health issues as told by your doctor. These include: ? High blood pressure (hypertension). ? Diabetes.  Follow a heart-healthy diet. Your doctor can help you to choose healthy food options and make changes.  Talk to your doctor to learn more about healthy cooking methods and use them. These include: ? Roasting. ? Grilling. ? Broiling. ? Baking. ? Poaching. ? Steaming. ? Stir-frying.  Follow an exercise program approved by your doctor.  Keep a healthy weight. Lose weight as told by your doctor.  Rest when you are tired.  Learn to manage stress.  Do not use any tobacco, such as cigarettes, chewing tobacco, or electronic cigarettes. If you need help quitting, ask your doctor.  If you drink alcohol, and your doctor says it is okay, limit yourself to no more than 1 drink per day. One drink equals 12 ounces of beer, 5 ounces of wine, or 1 ounces of hard liquor.  Stop illegal drug use.  Keep all follow-up visits as told  by your doctor. This is important. Do not take these medicines unless your doctor says that you can:  Nonsteroidal anti-inflammatory drugs (NSAIDs). These include: ? Ibuprofen. ? Naproxen. ? Celecoxib.  Vitamin supplements that have vitamin A, vitamin E, or both.  Hormone therapy that contains estrogen with or without progestin.  Get help right away if:  You have pain in your chest, neck, arm, jaw, stomach, or back that: ? Lasts more than a few minutes. ? Comes back. ? Does not get better after you take medicine under your tongue (sublingual nitroglycerin).  You have any of these symptoms for no reason: ? Gas, heartburn, or indigestion. ? Sweating a lot. ? Shortness of breath or trouble breathing. ? Feeling sick to your stomach or throwing up. ? Feeling more tired than usual. ? Feeling nervous or worrying more than usual. ? Feeling weak. ? Diarrhea.  You are suddenly dizzy or light-headed.  You faint or pass out. These symptoms may be an emergency. Do not wait to see if the symptoms will go away. Get medical help right away. Call your local emergency services (911 in the [place] Do not drive yourself to the hospital. This information is not intended to replace advice given to you by your health care provider. Make sure you discuss any questions you have with your health care provider. Document Released: 06/11/2007 Document Revised: 05/31/2015 Document Reviewed: 04/26/2013 Elsevier Interactive Patient Education  2017 Elsevier Inc.  

## 2015-12-11 ENCOUNTER — Other Ambulatory Visit: Payer: Self-pay | Admitting: *Deleted

## 2015-12-11 DIAGNOSIS — E034 Atrophy of thyroid (acquired): Secondary | ICD-10-CM

## 2015-12-11 LAB — CMP14+EGFR
A/G RATIO: 1.7 (ref 1.2–2.2)
ALT: 20 IU/L (ref 0–32)
AST: 17 IU/L (ref 0–40)
Albumin: 4.7 g/dL (ref 3.5–4.8)
Alkaline Phosphatase: 105 IU/L (ref 39–117)
BILIRUBIN TOTAL: 0.4 mg/dL (ref 0.0–1.2)
BUN/Creatinine Ratio: 18 (ref 12–28)
BUN: 14 mg/dL (ref 8–27)
CHLORIDE: 99 mmol/L (ref 96–106)
CO2: 28 mmol/L (ref 18–29)
Calcium: 9.7 mg/dL (ref 8.7–10.3)
Creatinine, Ser: 0.76 mg/dL (ref 0.57–1.00)
GFR calc Af Amer: 89 mL/min/{1.73_m2} (ref >59)
GFR calc non Af Amer: 77 mL/min/{1.73_m2} (ref >59)
Globulin, Total: 2.7 g/dL (ref 1.5–4.5)
Glucose: 104 mg/dL — ABNORMAL HIGH (ref 65–99)
POTASSIUM: 4.6 mmol/L (ref 3.5–5.2)
Sodium: 142 mmol/L (ref 134–144)
Total Protein: 7.4 g/dL (ref 6.0–8.5)

## 2015-12-11 LAB — THYROID PANEL WITH TSH
FREE THYROXINE INDEX: 2.6 (ref 1.2–4.9)
T3 Uptake Ratio: 31 % (ref 24–39)
T4, Total: 8.5 ug/dL (ref 4.5–12.0)
TSH: 0.397 u[IU]/mL — ABNORMAL LOW (ref 0.450–4.500)

## 2015-12-11 LAB — LIPID PANEL
Chol/HDL Ratio: 2.9 ratio units (ref 0.0–4.4)
Cholesterol, Total: 138 mg/dL (ref 100–199)
HDL: 47 mg/dL (ref >39)
LDL Calculated: 59 mg/dL (ref 0–99)
TRIGLYCERIDES: 158 mg/dL — AB (ref 0–149)
VLDL Cholesterol Cal: 32 mg/dL (ref 5–40)

## 2015-12-11 MED ORDER — LEVOTHYROXINE SODIUM 112 MCG PO TABS: ORAL_TABLET | ORAL | 0 refills | Status: DC

## 2016-01-02 ENCOUNTER — Encounter: Payer: Medicare Other | Admitting: *Deleted

## 2016-01-02 DIAGNOSIS — Z1231 Encounter for screening mammogram for malignant neoplasm of breast: Secondary | ICD-10-CM | POA: Diagnosis not present

## 2016-01-08 ENCOUNTER — Encounter: Payer: Self-pay | Admitting: *Deleted

## 2016-01-09 ENCOUNTER — Encounter: Payer: Self-pay | Admitting: Cardiovascular Disease

## 2016-01-09 ENCOUNTER — Ambulatory Visit (INDEPENDENT_AMBULATORY_CARE_PROVIDER_SITE_OTHER): Payer: Medicare Other

## 2016-01-09 ENCOUNTER — Encounter: Payer: Self-pay | Admitting: *Deleted

## 2016-01-09 ENCOUNTER — Ambulatory Visit (INDEPENDENT_AMBULATORY_CARE_PROVIDER_SITE_OTHER): Payer: Medicare Other | Admitting: Cardiovascular Disease

## 2016-01-09 ENCOUNTER — Other Ambulatory Visit: Payer: Self-pay

## 2016-01-09 VITALS — BP 138/84 | HR 85 | Ht 61.0 in | Wt 195.0 lb

## 2016-01-09 DIAGNOSIS — R079 Chest pain, unspecified: Secondary | ICD-10-CM

## 2016-01-09 DIAGNOSIS — E7800 Pure hypercholesterolemia, unspecified: Secondary | ICD-10-CM | POA: Diagnosis not present

## 2016-01-09 NOTE — Patient Instructions (Signed)
Medication Instructions:  Continue all current medications.  Labwork: none  Testing/Procedures:  Your physician has requested that you have an echocardiogram. Echocardiography is a painless test that uses sound waves to create images of your heart. It provides your doctor with information about the size and shape of your heart and how well your heart's chambers and valves are working. This procedure takes approximately one hour. There are no restrictions for this procedure.  Your physician has requested that you have en exercise stress myoview. For further information please visit [email or web address]. Please follow instruction sheet, as given.  Office will contact with results via phone or letter.    Follow-Up: 6 weeks   Any Other Special Instructions Will Be Listed Below (If Applicable).  If you need a refill on your cardiac medications before your next appointment, please call your pharmacy.  

## 2016-01-09 NOTE — Progress Notes (Signed)
CARDIOLOGY CONSULT NOTE  Patient ID: Sheila Ferrell MRN: [id number] DOB/AGE: Nov 22, 1940 76 y.o.  Admit date: (Not on file) Primary Physician: [name], [place] Referring Physician:   Reason for Consultation: chest pain  HPI: The patient is a 76 year old woman with hypothyroidism, hyperlipidemia, and depression who is referred for evaluation of chest pain. She has had intermittent chest pains for the past 6 months. They can occur both with and without exertion. It is located in the upper left chest and in the left bicep with some hand and arm tingling and numbness. She said it is initially dull and then it becomes sharp. There are associated palpitations and lightheadedness. It occasionally radiates into the upper left shoulder. Says she is always tired.  She has chronic exertional dyspnea and attributes that to obesity and recent weight gain.  ECG performed early December 2017 showed normal sinus rhythm with late R wave progression.  Family history: Brother had a coronary stent at age 25.   Lipids 12/10/15: Total cholesterol 138, triglycerides 158, HDL 47, LDL 59.   No Known Allergies  Current Outpatient Prescriptions  Medication Sig Dispense Refill  . alendronate (FOSAMAX) 70 MG tablet TAKE 1 TABLET BY MOUTH ONCE A WEEK ON AN EMPTY STOMACH WITH A FULL GLA SS OF WATER AS INSTRUCTED 12 tablet 5  . atorvastatin (LIPITOR) 20 MG tablet TAKE 1 TABLET BY MOUTH ONCE A DAY EVERY EVENING AT 6PM 90 tablet 1  . Cholecalciferol (VITAMIN D-3) 1000 UNITS CAPS Take by mouth daily.    . COCONUT OIL PO Take by mouth 2 (two) times daily.    Marland Kitchen levothyroxine (SYNTHROID) 112 MCG tablet TAKE ONE TABLET BY MOUTH ONE TIME DAILY BEFORE BREAKFAST 90 tablet 0  . nitroGLYCERIN (NITROSTAT) 0.4 MG SL tablet Place 1 tablet (0.4 mg total) under the tongue every 5 (five) minutes as needed for chest pain. 50 tablet 3  . sertraline (ZOLOFT) 100 MG tablet Take 1 tablet (100 mg total) by mouth  daily. 90 tablet 1   No current facility-administered medications for this visit.     Past Medical History:  Diagnosis Date  . Allergic rhinitis   . Anxiety   . Depression   . Hyperlipidemia   . Hypothyroidism 12/97  . Impacted cerumen of left ear   . Insomnia   . Osteoporosis   . Postmenopausal 1999  . Thyromegaly 2000    Past Surgical History:  Procedure Laterality Date  . KNEE SURGERY    . NSVD  1963  . NSVD  1968  . NSVD  1971  . NSVD  1977  . TUBAL LIGATION  1977   BILATERAL      Social History   Social History  . Marital status: Married    Spouse name: N/A  . Number of children: N/A  . Years of education: N/A   Occupational History  . Not on file.   Social History Main Topics  . Smoking status: Former Smoker    Types: Cigarettes  . Smokeless tobacco: Never Used  . Alcohol use No  . Drug use: No  . Sexual activity: Not on file   Other Topics Concern  . Not on file   Social History Narrative  . No narrative on file     No family history of premature CAD in 1st degree relatives.  Prior to Admission medications   Medication Sig Start Date End Date Taking? Authorizing Provider  alendronate (FOSAMAX) 70 MG tablet TAKE 1  TABLET BY MOUTH ONCE A WEEK ON AN EMPTY STOMACH WITH A FULL GLA SS OF WATER AS INSTRUCTED 12/10/15  Yes Mary-Margaret Hassell Done, FNP  atorvastatin (LIPITOR) 20 MG tablet TAKE 1 TABLET BY MOUTH ONCE A DAY EVERY EVENING AT 6PM 12/10/15  Yes Mary-Margaret Hassell Done, FNP  Cholecalciferol (VITAMIN D-3) 1000 UNITS CAPS Take by mouth daily.   Yes Historical Provider, MD  COCONUT OIL PO Take by mouth 2 (two) times daily.   Yes Historical Provider, MD  levothyroxine (SYNTHROID) 112 MCG tablet TAKE ONE TABLET BY MOUTH ONE TIME DAILY BEFORE BREAKFAST 12/11/15  Yes Mary-Margaret Hassell Done, FNP  nitroGLYCERIN (NITROSTAT) 0.4 MG SL tablet Place 1 tablet (0.4 mg total) under the tongue every 5 (five) minutes as needed for chest pain. 12/10/15  Yes Mary-Margaret  Hassell Done, FNP  sertraline (ZOLOFT) 100 MG tablet Take 1 tablet (100 mg total) by mouth daily. 12/10/15  Yes Mary-Margaret Hassell Done, FNP     Review of systems complete and found to be negative unless listed above in HPI     Physical exam Blood pressure 138/84, pulse 85, height 5\' 1"  (1.549 m), weight 195 lb (88.5 kg), SpO2 94 %. General: NAD Neck: No JVD, no thyromegaly or thyroid nodule.  Lungs: Clear to auscultation bilaterally with normal respiratory effort. CV: Nondisplaced PMI. Regular rate and rhythm, normal S1/S2, no S3/S4, no murmur.  No peripheral edema.  No carotid bruit.  Normal pedal pulses.  Abdomen: Soft, nontender, obese, no distention.  Skin: Intact without lesions or rashes.  Neurologic: Alert and oriented x 3.  Psych: Normal affect. Extremities: No clubbing or cyanosis.  HEENT: Normal.   ECG: Most recent ECG reviewed.  Labs:   Lab Results  Component Value Date   WBC 7.5 10/12/2013   HGB 13.4 10/12/2013   HCT 43.9 10/12/2013   MCV 95.8 10/12/2013   No results for input(s): NA, K, CL, CO2, BUN, CREATININE, CALCIUM, PROT, BILITOT, ALKPHOS, ALT, AST, GLUCOSE in the last 168 hours.  Invalid input(s): LABALBU No results found for: CKTOTAL, CKMB, CKMBINDEX, TROPONINI  Lab Results  Component Value Date   CHOL 138 12/10/2015   CHOL 113 06/08/2015   CHOL 137 12/04/2014   Lab Results  Component Value Date   HDL 47 12/10/2015   HDL 46 06/08/2015   HDL 53 12/04/2014   Lab Results  Component Value Date   LDLCALC 59 12/10/2015   LDLCALC 45 06/08/2015   LDLCALC 57 12/04/2014   Lab Results  Component Value Date   TRIG 158 (H) 12/10/2015   TRIG 108 06/08/2015   TRIG 135 12/04/2014   Lab Results  Component Value Date   CHOLHDL 2.9 12/10/2015   CHOLHDL 2.5 06/08/2015   CHOLHDL 2.6 12/04/2014   No results found for: LDLDIRECT       Studies: No results found.  ASSESSMENT AND PLAN:  1. Chest pain: Typical and atypical symptoms. I will proceed with a  nuclear myocardial perfusion imaging study (exercise Myoview) to evaluate for ischemic heart disease. I will order a 2-D echocardiogram with Doppler to evaluate cardiac structure, function, and regional wall motion.  2. Hyperlipidemia: Continue statin therapy.  Dispo: fu 2 months    Signed: Kate Sable, M.D., F.A.C.C.  01/09/2016, 9:13 AM

## 2016-01-10 LAB — ECHOCARDIOGRAM COMPLETE
Height: 61 in
Weight: 3120 oz

## 2016-01-11 ENCOUNTER — Telehealth: Payer: Self-pay

## 2016-01-11 DIAGNOSIS — R0789 Other chest pain: Secondary | ICD-10-CM

## 2016-01-11 NOTE — Telephone Encounter (Signed)
-----   Message from Herminio Commons, MD sent at 01/11/2016  8:28 AM EST ----- Please do a LIMITED echo with contrast to evaluate LV apex.

## 2016-01-11 NOTE — Telephone Encounter (Signed)
Order placed,pt aware,has stress test on Tuesday 01/15/16 and wonders if it can be done then.I told her Jarrett Soho would call to schedule

## 2016-01-14 ENCOUNTER — Other Ambulatory Visit (HOSPITAL_COMMUNITY): Payer: Medicare Other

## 2016-01-15 ENCOUNTER — Encounter (HOSPITAL_COMMUNITY): Payer: Medicare Other

## 2016-01-21 ENCOUNTER — Ambulatory Visit (HOSPITAL_COMMUNITY)
Admission: RE | Admit: 2016-01-21 | Discharge: 2016-01-21 | Disposition: A | Discharge status: Home / Self Care | Payer: Medicare Other | Source: Ambulatory Visit | Attending: Cardiovascular Disease | Admitting: Cardiovascular Disease

## 2016-01-21 ENCOUNTER — Encounter (HOSPITAL_COMMUNITY)
Admission: RE | Admit: 2016-01-21 | Discharge: 2016-01-21 | Disposition: A | Discharge status: Home / Self Care | Payer: Medicare Other | Source: Ambulatory Visit | Attending: Cardiovascular Disease | Admitting: Cardiovascular Disease

## 2016-01-21 ENCOUNTER — Inpatient Hospital Stay (HOSPITAL_COMMUNITY): Admission: RE | Admit: 2016-01-21 | Payer: Medicare Other | Source: Ambulatory Visit

## 2016-01-21 ENCOUNTER — Encounter (HOSPITAL_COMMUNITY): Payer: Self-pay

## 2016-01-21 DIAGNOSIS — R0789 Other chest pain: Secondary | ICD-10-CM | POA: Diagnosis not present

## 2016-01-21 DIAGNOSIS — R079 Chest pain, unspecified: Secondary | ICD-10-CM | POA: Insufficient documentation

## 2016-01-21 LAB — NM MYOCAR MULTI W/SPECT W/WALL MOTION / EF
CHL CUP NUCLEAR SRS: 0
CHL CUP RESTING HR STRESS: 85 {beats}/min
LV dias vol: 72 mL (ref 46–106)
LVSYSVOL: 22 mL
NUC STRESS TID: 0.95
Peak HR: 111 {beats}/min
RATE: 0.3
SDS: 0
SSS: 0

## 2016-01-21 MED ORDER — TECHNETIUM TC 99M TETROFOSMIN IV KIT
30.0000 | PACK | Freq: Once | INTRAVENOUS | Status: AC | PRN
  Administered 2016-01-21: 31 via INTRAVENOUS

## 2016-01-21 MED ORDER — TECHNETIUM TC 99M TETROFOSMIN IV KIT
10.0000 | PACK | Freq: Once | INTRAVENOUS | Status: AC | PRN
  Administered 2016-01-21: 10.2 via INTRAVENOUS

## 2016-01-21 MED ORDER — SODIUM CHLORIDE 0.9% FLUSH
INTRAVENOUS | Status: AC
  Administered 2016-01-21: 10 mL via INTRAVENOUS
  Filled 2016-01-21: qty 10

## 2016-01-21 MED ORDER — REGADENOSON 0.4 MG/5ML IV SOLN
INTRAVENOUS | Status: AC
  Administered 2016-01-21: 0.4 mg via INTRAVENOUS
  Filled 2016-01-21: qty 5

## 2016-01-21 MED ORDER — PERFLUTREN LIPID MICROSPHERE
1.0000 mL | INTRAVENOUS | Status: AC | PRN
  Administered 2016-01-21: 2 mL via INTRAVENOUS
  Administered 2016-01-21: 1 mL via INTRAVENOUS
  Filled 2016-01-21: qty 10

## 2016-01-21 NOTE — Progress Notes (Signed)
*  PRELIMINARY RESULTS* Echocardiogram Limited 2D Echocardiogram has been performed with Definity.  Samuel Germany 01/21/2016, 1:33 PM

## 2016-01-22 ENCOUNTER — Encounter: Payer: Self-pay | Admitting: *Deleted

## 2016-02-18 ENCOUNTER — Encounter: Payer: Self-pay | Admitting: Cardiovascular Disease

## 2016-02-18 ENCOUNTER — Ambulatory Visit (INDEPENDENT_AMBULATORY_CARE_PROVIDER_SITE_OTHER): Payer: Medicare Other | Admitting: Cardiovascular Disease

## 2016-02-18 VITALS — BP 130/72 | HR 106 | Ht 61.0 in | Wt 179.0 lb

## 2016-02-18 DIAGNOSIS — R079 Chest pain, unspecified: Secondary | ICD-10-CM

## 2016-02-18 DIAGNOSIS — M25512 Pain in left shoulder: Secondary | ICD-10-CM | POA: Diagnosis not present

## 2016-02-18 DIAGNOSIS — E7800 Pure hypercholesterolemia, unspecified: Secondary | ICD-10-CM

## 2016-02-18 NOTE — Patient Instructions (Signed)
Medication Instructions:  Continue all current medications.  Labwork: none  Testing/Procedures: none  Follow-Up: As needed.    Any Other Special Instructions Will Be Listed Below (If Applicable).  If you need a refill on your cardiac medications before your next appointment, please call your pharmacy.  

## 2016-02-18 NOTE — Progress Notes (Signed)
SUBJECTIVE: The patient returns for follow-up after undergoing cardiovascular testing performed for the evaluation of chest pain.  She underwent a normal nuclear stress test on 01/21/16.  Echocardiogram 01/09/16 showed normal left ventricular systolic function, LVEF 0000000, with grade 1 diastolic dysfunction. There was prominent trabeculation with calcification noted in the left ventricular apex and a contrast echo was suggested to exclude mass or thrombus.  Repeat limited echo with contrast performed on 01/21/16 again demonstrated normal left ventricular systolic function and regional wall motion, LVEF 55-60%, with no mass or thrombus noted in the LV apex with nonpathologic trabeculations.  She continues to have left infra-axillary and left shoulder and arm pain. This can occur both with and without exertion. She denies lifting any heavy objects.    Review of Systems: As per "subjective", otherwise negative.  No Known Allergies  Current Outpatient Prescriptions  Medication Sig Dispense Refill  . alendronate (FOSAMAX) 70 MG tablet TAKE 1 TABLET BY MOUTH ONCE A WEEK ON AN EMPTY STOMACH WITH A FULL GLA SS OF WATER AS INSTRUCTED 12 tablet 5  . atorvastatin (LIPITOR) 20 MG tablet TAKE 1 TABLET BY MOUTH ONCE A DAY EVERY EVENING AT 6PM 90 tablet 1  . Cholecalciferol (VITAMIN D-3) 1000 UNITS CAPS Take by mouth daily.    . cyanocobalamin 100 MCG tablet Take 100 mcg by mouth daily.    Marland Kitchen levothyroxine (SYNTHROID) 112 MCG tablet TAKE ONE TABLET BY MOUTH ONE TIME DAILY BEFORE BREAKFAST 90 tablet 0  . nitroGLYCERIN (NITROSTAT) 0.4 MG SL tablet Place 1 tablet (0.4 mg total) under the tongue every 5 (five) minutes as needed for chest pain. 50 tablet 3  . sertraline (ZOLOFT) 100 MG tablet Take 1 tablet (100 mg total) by mouth daily. 90 tablet 1   No current facility-administered medications for this visit.     Past Medical History:  Diagnosis Date  . Allergic rhinitis   . Anxiety   .  Depression   . Hyperlipidemia   . Hypothyroidism 12/97  . Impacted cerumen of left ear   . Insomnia   . Osteoporosis   . Postmenopausal 1999  . Thyromegaly 2000    Past Surgical History:  Procedure Laterality Date  . KNEE SURGERY    . NSVD  1963  . NSVD  1968  . NSVD  1971  . NSVD  1977  . TUBAL LIGATION  1977   BILATERAL      Social History   Social History  . Marital status: Married    Spouse name: N/A  . Number of children: N/A  . Years of education: N/A   Occupational History  . Not on file.   Social History Main Topics  . Smoking status: Former Smoker    Types: Cigarettes  . Smokeless tobacco: Never Used  . Alcohol use No  . Drug use: No  . Sexual activity: Not on file   Other Topics Concern  . Not on file   Social History Narrative  . No narrative on file     Vitals:   02/18/16 1610  BP: 130/72  Pulse: (!) 106  SpO2: 96%  Weight: 179 lb (81.2 kg)  Height: 5\' 1"  (1.549 m)    PHYSICAL EXAM General: NAD Neck: No JVD, no thyromegaly or thyroid nodule.  Lungs: Clear to auscultation bilaterally with normal respiratory effort. CV: Nondisplaced PMI. Regular rate and rhythm, normal S1/S2, no S3/S4, no murmur.  No peripheral edema.  No carotid bruit.  Normal pedal pulses.  Abdomen: Soft, nontender, obese, no distention.  Skin: Intact without lesions or rashes.  Neurologic: Alert and oriented x 3.  Psych: Normal affect. Extremities: No clubbing or cyanosis.  HEENT: Normal.  Musculoskeletal: Limited range of motion of left shoulder with rotation.    ECG: Most recent ECG reviewed.      ASSESSMENT AND PLAN:  1. Chest pain: Normal stress test. No further CV testing indicated at this time.  2. Hyperlipidemia: Continue statin therapy.  3. Left shoulder and arm pain with limited range of motion: May have rotator cuff pathology. I have recommended she obtain an orthopedic evaluation.  Dispo: fu prn  Kate Sable, M.D., F.A.C.C.

## 2016-02-20 ENCOUNTER — Ambulatory Visit: Payer: Medicare Other | Admitting: Cardiovascular Disease

## 2016-02-28 ENCOUNTER — Encounter: Payer: Self-pay | Admitting: Nurse Practitioner

## 2016-02-28 ENCOUNTER — Ambulatory Visit (INDEPENDENT_AMBULATORY_CARE_PROVIDER_SITE_OTHER): Payer: Medicare Other | Admitting: Nurse Practitioner

## 2016-02-28 VITALS — BP 130/74 | HR 91 | Temp 96.1°F | Ht 61.0 in | Wt 177.0 lb

## 2016-02-28 DIAGNOSIS — M79622 Pain in left upper arm: Secondary | ICD-10-CM | POA: Diagnosis not present

## 2016-02-28 MED ORDER — NAPROXEN 500 MG PO TABS: 500.0000 mg | ORAL_TABLET | Freq: Two times a day (BID) | ORAL | 1 refills | Status: DC

## 2016-02-28 NOTE — Progress Notes (Signed)
   Subjective:    Patient ID: Sheila Ferrell, female    DOB: 1940/11/13, 76 y.o.   MRN: [id number]  HPI Patient was seen 12/09/16 for regular follow up and at that time she was c/o chest and left arm pain- So we did EKG which had a few changes. We referred her to cardiology. They did echo and perfusion test and determined pain was not coming from her heart. Cardiology seems to think pain is coming from shoulder. She rates pain 10/10 at times depending on how she is moving it. Other times it is 4/10. Pian is worse in deltoid area.    Review of Systems  Constitutional: Negative.   Respiratory: Negative.   Cardiovascular: Negative.   Gastrointestinal: Negative.   Neurological: Negative.   Psychiatric/Behavioral: Negative.   All other systems reviewed and are negative.      Objective:   Physical Exam  Constitutional: She is oriented to person, place, and time. She appears well-developed and well-nourished. No distress.  Cardiovascular: Normal rate and regular rhythm.   Pulmonary/Chest: Effort normal.  Musculoskeletal:  Pain on palpation left upper arm FROM of left shoulder with pain n internal rotation and abduction. No point tenderness in shoulder itself Grips equal bil  Neurological: She is alert and oriented to person, place, and time.  Skin: Skin is warm.  Psychiatric: She has a normal mood and affect. Her behavior is normal. Judgment and thought content normal.   BP 130/74   Pulse 91   Temp (!) 96.1 F (35.6 C) (Oral)   Ht 5\' 1"  (1.549 m)   Wt 177 lb (80.3 kg)   BMI 33.44 kg/m      Assessment & Plan:  1. Left upper arm pain Moist heat to area bid Message if helps RTO prn Do not take meds on empty stomach - naproxen (NAPROSYN) 500 MG tablet; Take 1 tablet (500 mg total) by mouth 2 (two) times daily with a meal.  Dispense: 60 tablet; Refill: 1 - Ambulatory referral to [facility] [place]

## 2016-02-28 NOTE — Patient Instructions (Signed)
Adhesive Capsulitis Introduction Adhesive capsulitis is inflammation of the tendons and ligaments that surround the shoulder joint (shoulder capsule). This condition causes the shoulder to become stiff and painful to move. Adhesive capsulitis is also called frozen shoulder. What are the causes? This condition may be caused by:  An injury to the shoulder joint.  Straining the shoulder.  Not moving the shoulder for a period of time. This can happen if your arm was injured or in a sling.  Long-standing health problems, such as:  Diabetes.  Thyroid problems.  Heart disease.  Stroke.  Rheumatoid arthritis.  Lung disease. In some cases, the cause may not be known. What increases the risk? This condition is more likely to develop in:  Women.  People who are older than 76 years of age. What are the signs or symptoms? Symptoms of this condition include:  Pain in the shoulder when moving the arm. There may also be pain when parts of the shoulder are touched. The pain is worse at night or when at rest.  Soreness or aching in the shoulder.  Inability to move the shoulder normally.  Muscle spasms. How is this diagnosed? This condition is diagnosed with a physical exam and imaging tests, such as an X-ray or MRI. How is this treated? This condition may be treated with:  Treatment of the underlying cause or condition.  Physical therapy. This involves performing exercises to get the shoulder moving again.  Medicine. Medicine may be given to relieve pain, inflammation, or muscle spasms.  Steroid injections into the shoulder joint.  Shoulder manipulation. This is a procedure to move the shoulder into another position. It is done after you are given a medicine to make you fall asleep (general anesthetic). The joint may also be injected with salt water at high pressure to break down scarring.  Surgery. This may be done in severe cases when other treatments have failed. Although  most people recover completely from adhesive capsulitis, some may not regain the full movement of the shoulder. Follow these instructions at home:  Take over-the-counter and prescription medicines only as told by your health care provider.  If you are being treated with physical therapy, follow instructions from your physical therapist.  Avoid exercises that put a lot of demand on your shoulder, such as throwing. These exercises can make pain worse.  If directed, apply ice to the injured area:  Put ice in a plastic bag.  Place a towel between your skin and the bag.  Leave the ice on for 20 minutes, 2-3 times per day. Contact a health care provider if:  You develop new symptoms.  Your symptoms get worse. This information is not intended to replace advice given to you by your health care provider. Make sure you discuss any questions you have with your health care provider. Document Released: 10/20/2008 Document Revised: 05/31/2015 Document Reviewed: 04/17/2014  2017 Elsevier

## 2016-03-03 DIAGNOSIS — M25512 Pain in left shoulder: Secondary | ICD-10-CM | POA: Diagnosis not present

## 2016-03-24 ENCOUNTER — Telehealth: Payer: Self-pay | Admitting: Nurse Practitioner

## 2016-03-24 NOTE — Telephone Encounter (Signed)
Stool card place at front desk for patient to pick up

## 2016-03-24 NOTE — Progress Notes (Signed)
[id number] 03/19

## 2016-04-30 ENCOUNTER — Other Ambulatory Visit: Payer: Self-pay | Admitting: Nurse Practitioner

## 2016-04-30 DIAGNOSIS — E034 Atrophy of thyroid (acquired): Secondary | ICD-10-CM

## 2016-05-05 ENCOUNTER — Other Ambulatory Visit: Payer: Self-pay | Admitting: Nurse Practitioner

## 2016-05-05 DIAGNOSIS — E782 Mixed hyperlipidemia: Secondary | ICD-10-CM

## 2016-05-23 ENCOUNTER — Other Ambulatory Visit: Payer: Self-pay | Admitting: Nurse Practitioner

## 2016-05-23 DIAGNOSIS — F3342 Major depressive disorder, recurrent, in full remission: Secondary | ICD-10-CM

## 2016-06-05 DIAGNOSIS — H26493 Other secondary cataract, bilateral: Secondary | ICD-10-CM | POA: Diagnosis not present

## 2016-06-05 DIAGNOSIS — H43393 Other vitreous opacities, bilateral: Secondary | ICD-10-CM | POA: Diagnosis not present

## 2016-06-05 DIAGNOSIS — H43813 Vitreous degeneration, bilateral: Secondary | ICD-10-CM | POA: Diagnosis not present

## 2016-06-05 DIAGNOSIS — H1045 Other chronic allergic conjunctivitis: Secondary | ICD-10-CM | POA: Diagnosis not present

## 2016-06-06 ENCOUNTER — Encounter: Payer: Self-pay | Admitting: Nurse Practitioner

## 2016-06-06 ENCOUNTER — Ambulatory Visit (INDEPENDENT_AMBULATORY_CARE_PROVIDER_SITE_OTHER): Payer: Medicare Other | Admitting: Nurse Practitioner

## 2016-06-06 VITALS — BP 122/64 | HR 75 | Temp 97.0°F | Ht 61.0 in | Wt 178.0 lb

## 2016-06-06 DIAGNOSIS — F411 Generalized anxiety disorder: Secondary | ICD-10-CM | POA: Diagnosis not present

## 2016-06-06 DIAGNOSIS — M810 Age-related osteoporosis without current pathological fracture: Secondary | ICD-10-CM

## 2016-06-06 DIAGNOSIS — E034 Atrophy of thyroid (acquired): Secondary | ICD-10-CM | POA: Diagnosis not present

## 2016-06-06 DIAGNOSIS — Z6833 Body mass index (BMI) 33.0-33.9, adult: Secondary | ICD-10-CM

## 2016-06-06 DIAGNOSIS — F3342 Major depressive disorder, recurrent, in full remission: Secondary | ICD-10-CM

## 2016-06-06 DIAGNOSIS — E559 Vitamin D deficiency, unspecified: Secondary | ICD-10-CM

## 2016-06-06 DIAGNOSIS — E782 Mixed hyperlipidemia: Secondary | ICD-10-CM

## 2016-06-06 MED ORDER — SERTRALINE HCL 100 MG PO TABS: ORAL_TABLET | ORAL | 1 refills | Status: DC

## 2016-06-06 MED ORDER — ATORVASTATIN CALCIUM 20 MG PO TABS: ORAL_TABLET | ORAL | 1 refills | Status: DC

## 2016-06-06 NOTE — Patient Instructions (Signed)
Stress and Stress Management Stress is a normal reaction to life events. It is what you feel when life demands more than you are used to or more than you can handle. Some stress can be useful. For example, the stress reaction can help you catch the last bus of the day, study for a test, or meet a deadline at work. But stress that occurs too often or for too long can cause problems. It can affect your emotional health and interfere with relationships and normal daily activities. Too much stress can weaken your immune system and increase your risk for physical illness. If you already have a medical problem, stress can make it worse. What are the causes? All sorts of life events may cause stress. An event that causes stress for one person may not be stressful for another person. Major life events commonly cause stress. These may be positive or negative. Examples include losing your job, moving into a new home, getting married, having a baby, or losing a loved one. Less obvious life events may also cause stress, especially if they occur day after day or in combination. Examples include working long hours, driving in traffic, caring for children, being in debt, or being in a difficult relationship. What are the signs or symptoms? Stress may cause emotional symptoms including, the following:  Anxiety. This is feeling worried, afraid, on edge, overwhelmed, or out of control.  Anger. This is feeling irritated or impatient.  Depression. This is feeling sad, down, helpless, or guilty.  Difficulty focusing, remembering, or making decisions.  Stress may cause physical symptoms, including the following:  Aches and pains. These may affect your head, neck, back, stomach, or other areas of your body.  Tight muscles or clenched jaw.  Low energy or trouble sleeping.  Stress may cause unhealthy behaviors, including the following:  Eating to feel better (overeating) or skipping meals.  Sleeping too little,  too much, or both.  Working too much or putting off tasks (procrastination).  Smoking, drinking alcohol, or using drugs to feel better.  How is this diagnosed? Stress is diagnosed through an assessment by your health care provider. Your health care provider will ask questions about your symptoms and any stressful life events.Your health care provider will also ask about your medical history and may order blood tests or other tests. Certain medical conditions and medicine can cause physical symptoms similar to stress. Mental illness can cause emotional symptoms and unhealthy behaviors similar to stress. Your health care provider may refer you to a mental health professional for further evaluation. How is this treated? Stress management is the recommended treatment for stress.The goals of stress management are reducing stressful life events and coping with stress in healthy ways. Techniques for reducing stressful life events include the following:  Stress identification. Self-monitor for stress and identify what causes stress for you. These skills may help you to avoid some stressful events.  Time management. Set your priorities, keep a calendar of events, and learn to say "no." These tools can help you avoid making too many commitments.  Techniques for coping with stress include the following:  Rethinking the problem. Try to think realistically about stressful events rather than ignoring them or overreacting. Try to find the positives in a stressful situation rather than focusing on the negatives.  Exercise. Physical exercise can release both physical and emotional tension. The key is to find a form of exercise you enjoy and do it regularly.  Relaxation techniques. These relax the body and  mind. Examples include yoga, meditation, tai chi, biofeedback, deep breathing, progressive muscle relaxation, listening to music, being out in nature, journaling, and other hobbies. Again, the key is to find  one or more that you enjoy and can do regularly.  Healthy lifestyle. Eat a balanced diet, get plenty of sleep, and do not smoke. Avoid using alcohol or drugs to relax.  Strong support network. Spend time with family, friends, or other people you enjoy being around.Express your feelings and talk things over with someone you trust.  Counseling or talktherapy with a mental health professional may be helpful if you are having difficulty managing stress on your own. Medicine is typically not recommended for the treatment of stress.Talk to your health care provider if you think you need medicine for symptoms of stress. Follow these instructions at home:  Keep all follow-up visits as directed by your health care provider.  Take all medicines as directed by your health care provider. Contact a health care provider if:  Your symptoms get worse or you start having new symptoms.  You feel overwhelmed by your problems and can no longer manage them on your own. Get help right away if:  You feel like hurting yourself or someone else. This information is not intended to replace advice given to you by your health care provider. Make sure you discuss any questions you have with your health care provider. Document Released: 06/18/2000 Document Revised: 05/31/2015 Document Reviewed: 08/17/2012 Elsevier Interactive Patient Education  2017 [employer].

## 2016-06-06 NOTE — Progress Notes (Signed)
Subjective:    Patient ID: Sheila Ferrell, female    DOB: [birth year], 76 y.o.   MRN: [id number]  HPI  Sheila Ferrell is here today for follow up of chronic medical problem.  Outpatient Encounter Prescriptions as of 06/06/2016  Medication Sig  . alendronate (FOSAMAX) 70 MG tablet TAKE 1 TABLET BY MOUTH ONCE A WEEK ON AN EMPTY STOMACH WITH A FULL GLA SS OF WATER AS INSTRUCTED  . atorvastatin (LIPITOR) 20 MG tablet TAKE 1 TABLET DAILY EVERY EVENING AT 6 P.M.  . Cholecalciferol (VITAMIN D-3) 1000 UNITS CAPS Take by mouth daily.  Marland Kitchen levothyroxine (SYNTHROID, LEVOTHROID) 112 MCG tablet TAKE 1 TABLET DAILY BEFORE BREAKFAST  . sertraline (ZOLOFT) 100 MG tablet TAKE 1 TABLET DAILY (DOSE CHANGE)    1. Hypothyroidism due to acquired atrophy of thyroid  No problems that she is aware of.  2. Age-related osteoporosis without current pathological fracture  Does very little weight bearing exercise  3. Vitamin D deficiency   No c/o back pain  4. Mixed hyperlipidemia  Doe snot watch diet  5. GAD (generalized anxiety disorder)  Doing well on zoloft  6. Recurrent major depressive disorder, in full remission ([place])  Takes zoloft daily- not doing as well as she has in past Depression screen [facility] 2/9 06/06/2016 02/28/2016 12/10/2015 06/08/2015 12/04/2014  Decreased Interest 1 1 2  0 0  Down, Depressed, Hopeless 1 1 2 1  0  PHQ - 2 Score 2 2 4 1  0  Altered sleeping 2 1 2  - -  Tired, decreased energy 3 2 3  - -  Change in appetite 3 1 3  - -  Feeling bad or failure about yourself  2 1 2  - -  Trouble concentrating 2 0 0 - -  Moving slowly or fidgety/restless 0 0 0 - -  Suicidal thoughts 0 1 2 - -  PHQ-9 Score 14 8 16  - -     7. BMI 33.0-33.9,adult  No recent weight gain or weight loss    New complaints: None today     Review of Systems  Constitutional: Negative for diaphoresis.  Eyes: Negative for pain.  Respiratory: Negative for shortness of breath.   Cardiovascular: Negative for chest pain,  palpitations and leg swelling.  Gastrointestinal: Negative for abdominal pain.  Endocrine: Negative for polydipsia.  Skin: Negative for rash.  Neurological: Negative for dizziness, weakness and headaches.  Hematological: Does not bruise/bleed easily.       Objective:   Physical Exam  Constitutional: She is oriented to person, place, and time. She appears well-developed and well-nourished.  HENT:  Nose: Nose normal.  Mouth/Throat: Oropharynx is clear and moist.  Eyes: EOM are normal.  Neck: Trachea normal, normal range of motion and full passive range of motion without pain. Neck supple. No JVD present. Carotid bruit is not present. No thyromegaly present.  Cardiovascular: Normal rate, regular rhythm, normal heart sounds and intact distal pulses.  Exam reveals no gallop and no friction rub.   No murmur heard. Pulmonary/Chest: Effort normal and breath sounds normal.  Abdominal: Soft. Bowel sounds are normal. She exhibits no distension and no mass. There is no tenderness.  Musculoskeletal: Normal range of motion.  Lymphadenopathy:    She has no cervical adenopathy.  Neurological: She is alert and oriented to person, place, and time. She has normal reflexes.  Skin: Skin is warm and dry.  Psychiatric: She has a normal mood and affect. Her behavior is normal. Judgment and thought content normal.  BP 122/64   Pulse 75   Temp 97 F (36.1 C) (Oral)   Ht 5\' 1"  (1.549 m)   Wt 178 lb (80.7 kg)   BMI 33.63 kg/m       Assessment & Plan:  1. Hypothyroidism due to acquired atrophy of thyroid  2. Age-related osteoporosis without current pathological fracture Weight bearing exercises encouraged  3. Vitamin D deficiency  4. Mixed hyperlipidemia Low fta diet - atorvastatin (LIPITOR) 20 MG tablet; TAKE 1 TABLET DAILY EVERY EVENING AT 6 P.M.  Dispense: 90 tablet; Refill: 1  5. GAD (generalized anxiety disorder) Stress management  6. Recurrent major depressive disorder, in full  remission ([place]) Patient does not wantto change meds at this time- thinks depresion is situational with husband being sick - sertraline (ZOLOFT) 100 MG tablet; TAKE 1 TABLET DAILY  Dispense: 90 tablet; Refill: 1  7. BMI 33.0-33.9,adult Discussed diet and exercise for person with BMI >25 Will recheck weight in 3-6 months    Labs pending Health maintenance reviewed Diet and exercise encouraged Continue all meds Follow up  In 3 months   [place], FNP

## 2016-06-06 NOTE — Addendum Note (Signed)
Addended by: Rolena Infante on: 06/06/2016 03:52 PM   Modules accepted: Orders

## 2016-06-07 LAB — CMP14+EGFR
ALBUMIN: 4.6 g/dL (ref 3.5–4.8)
ALT: 18 IU/L (ref 0–32)
AST: 17 IU/L (ref 0–40)
Albumin/Globulin Ratio: 2 (ref 1.2–2.2)
Alkaline Phosphatase: 93 IU/L (ref 39–117)
BILIRUBIN TOTAL: 0.3 mg/dL (ref 0.0–1.2)
BUN / CREAT RATIO: 11 — AB (ref 12–28)
BUN: 8 mg/dL (ref 8–27)
CALCIUM: 9.7 mg/dL (ref 8.7–10.3)
CHLORIDE: 102 mmol/L (ref 96–106)
CO2: 25 mmol/L (ref 18–29)
CREATININE: 0.71 mg/dL (ref 0.57–1.00)
GFR, EST AFRICAN AMERICAN: 96 mL/min/{1.73_m2} (ref >59)
GFR, EST NON AFRICAN AMERICAN: 83 mL/min/{1.73_m2} (ref >59)
GLUCOSE: 87 mg/dL (ref 65–99)
Globulin, Total: 2.3 g/dL (ref 1.5–4.5)
Potassium: 4.9 mmol/L (ref 3.5–5.2)
Sodium: 144 mmol/L (ref 134–144)
TOTAL PROTEIN: 6.9 g/dL (ref 6.0–8.5)

## 2016-06-07 LAB — LIPID PANEL
CHOL/HDL RATIO: 2.4 ratio (ref 0.0–4.4)
Cholesterol, Total: 120 mg/dL (ref 100–199)
HDL: 49 mg/dL (ref >39)
LDL CALC: 47 mg/dL (ref 0–99)
Triglycerides: 122 mg/dL (ref 0–149)
VLDL CHOLESTEROL CAL: 24 mg/dL (ref 5–40)

## 2016-06-07 LAB — THYROID PANEL WITH TSH
Free Thyroxine Index: 2.6 (ref 1.2–4.9)
T3 Uptake Ratio: 29 % (ref 24–39)
T4 TOTAL: 8.8 ug/dL (ref 4.5–12.0)
TSH: 0.689 u[IU]/mL (ref 0.450–4.500)

## 2016-06-13 ENCOUNTER — Ambulatory Visit (INDEPENDENT_AMBULATORY_CARE_PROVIDER_SITE_OTHER): Payer: Medicare Other | Admitting: *Deleted

## 2016-06-13 VITALS — BP 127/68 | HR 81 | Ht 61.0 in | Wt 178.0 lb

## 2016-06-13 DIAGNOSIS — Z1211 Encounter for screening for malignant neoplasm of colon: Secondary | ICD-10-CM | POA: Diagnosis not present

## 2016-06-13 DIAGNOSIS — Z0000 Encounter for general adult medical examination without abnormal findings: Secondary | ICD-10-CM | POA: Diagnosis not present

## 2016-06-13 NOTE — Progress Notes (Signed)
Subjective:   Sheila Ferrell is a 76 y.o. female who presents for an Initial Medicare Annual Wellness Visit. Sheila Ferrell lives at home with her husband. She is his caretaker because he has Parkinson's Disease and dementia. She is also her elderly mother's caregiver. She has a daughter that lives next door, one in [place], and one in [place]. She has a son as well who is a Marine scientist (PhD) in the Cath Lab at [employer]. She has 6 grandchildren. 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 She enjoys flower gardening, working puzzles, and playing games like [place] with her husband.   Review of Systems    Pysch: experiencing some stress and likely caregiver strain.   Cardiac Risk Factors include: advanced age (>51men, >63 women);dyslipidemia;obesity (BMI >30kg/m2)  She is concerned with her weight and inability to lose weight. She is hypothyroid but that is being treated. She is also taking Zoloft.   Other systems negative.     Reports that health is about the same as last year.   Objective:    Today's Vitals   06/13/16 1536  BP: 127/68  Pulse: 81  Weight: 178 lb (80.7 kg)  Height: 5\' 1"  (1.549 m)   Body mass index is 33.63 kg/m.   Current Medications (verified) Outpatient Encounter Prescriptions as of 06/13/2016  Medication Sig  . alendronate (FOSAMAX) 70 MG tablet TAKE 1 TABLET BY MOUTH ONCE A WEEK ON AN EMPTY STOMACH WITH A FULL GLA SS OF WATER AS INSTRUCTED  . atorvastatin (LIPITOR) 20 MG tablet TAKE 1 TABLET DAILY EVERY EVENING AT 6 P.M.  . Cholecalciferol (VITAMIN D-3) 1000 UNITS CAPS Take by mouth daily.  Marland Kitchen levothyroxine (SYNTHROID, LEVOTHROID) 112 MCG tablet TAKE 1 TABLET DAILY BEFORE BREAKFAST  . sertraline (ZOLOFT) 100 MG tablet TAKE 1 TABLET DAILY   No facility-administered encounter medications on file as of 06/13/2016.     Allergies (verified) Patient has no known allergies.   History: Past Medical History:  Diagnosis Date  . Allergic rhinitis   . Anxiety     . Depression   . Hyperlipidemia   . Hypothyroidism 12/97  . Impacted cerumen of left ear   . Insomnia   . Osteoporosis   . Postmenopausal 1999  . Thyromegaly 2000   Past Surgical History:  Procedure Laterality Date  . KNEE SURGERY    . NSVD  1963  . NSVD  1968  . NSVD  1971  . NSVD  1977  . TUBAL LIGATION  1977   BILATERAL     Family History  Problem Relation Age of Onset  . Melanoma Brother   . Hypertension Mother   . Stroke Mother   . Diabetes Paternal Grandfather   . Cancer Father        leukemia  . Heart disease Brother    Social History   Occupational History  . Not on file.   Social History Main Topics  . Smoking status: Former Smoker    Years: 20.00    Types: Cigarettes  . Smokeless tobacco: Never Used  . Alcohol use No  . Drug use: No  . Sexual activity: Not Currently    Tobacco Counseling Counseling given: Not Answered   Activities of Daily Living In your present state of health, do you have any difficulty performing the following activities: 06/13/2016  Hearing? N  Vision? N  Difficulty concentrating or making decisions? N  Walking or climbing stairs? N  Dressing or bathing? N  Doing errands, shopping?  N  Preparing Food and eating ? N  Using the Toilet? N  In the past six months, have you accidently leaked urine? N  Do you have problems with loss of bowel control? N  Managing your Medications? N  Managing your Finances? N  Housekeeping or managing your Housekeeping? N  Some recent data might be hidden    Immunizations and Health Maintenance Immunization History  Administered Date(s) Administered  . Influenza, High Dose Seasonal PF 10/15/2014, 10/16/2015  . Influenza,inj,Quad PF,36+ Mos 10/12/2013  . Pneumococcal Conjugate-13 01/10/2013  . Pneumococcal Polysaccharide-23 05/18/2014  . Tdap 04/11/2013   There are no preventive care reminders to display for this patient.  Patient Care Team: [name], FNP as PCP - General  (Nurse Practitioner)  Venetia Night, OD  No hospitalizations, ER visits or surgeries within the past year.     Assessment:   This is a routine wellness examination for Sheila Ferrell.   Hearing/Vision screen No deficits noted. Last eye exam was 2 weeks ago.   Dietary issues and exercise activities discussed: Current Exercise Habits: The patient does not participate in regular exercise at present (Stays busy at home taking care of husband and mother), Exercise limited by: None identified  Goals    . Exercise 150 minutes per week (moderate activity)          Walk for 30 minutes daily    . Stress Reduction          Take time for yourself      Depression Screen PHQ 2/9 Scores 06/13/2016 06/06/2016 02/28/2016 12/10/2015 06/08/2015 12/04/2014 08/24/2014  PHQ - 2 Score 0 2 2 4 1  0 1  PHQ- 9 Score 12 14 8 16  - - -    Fall Risk Fall Risk  06/13/2016 06/06/2016 02/28/2016 12/10/2015 06/08/2015  Falls in the past year? Yes No No No No    Cognitive Function: MMSE - Mini Mental State Exam 06/13/2016  Orientation to time 5  Orientation to Place 5  Registration 3  Attention/ Calculation 3  Recall 3  Language- name 2 objects 2  Language- repeat 1  Language- follow 3 step command 3  Language- read & follow direction 1  Write a sentence 1  Copy design 0  Total score 27  Normal exam      Screening Tests Health Maintenance  Topic Date Due  . INFLUENZA VACCINE  08/06/2016  . MAMMOGRAM  01/01/2017  . TETANUS/TDAP  04/12/2023  . DEXA SCAN  Completed  . PNA vac Low Risk Adult  Completed   She is taking Fosamax and hasn't had a recent dexa scan. Patient did not have time to complete it at this visit today. She will have it at her next PCP visit.   FOBT was returned today.   Plan:   Dexa scan at next visit with [name], [place] appt with Shelah Lewandowsky on 09/09/16. -May want to schedule a visit with Cherre Robins, PharmD to discuss nutrition and weight loss options.  -Stressed the importance  of managing stress and taking time for herself. Recommended counseling based on her PHQ-9 and conversation. She is treated with Zoloft currently. Patient will consider counseling and will let us know if she decides to pursue that option.  -Discussed that she should ask for help from other family members to help with her husband and mother. She agreed.  I have personally reviewed and noted the following in the patient's chart:   . Medical and social history . Use  of alcohol, tobacco or illicit drugs  . Current medications and supplements . Functional ability and status . Nutritional status . Physical activity . Advanced directives . List of other physicians . Hospitalizations, surgeries, and ER visits in previous 12 months . Vitals . Screenings to include cognitive, depression, and falls . Referrals and appointments  In addition, I have reviewed and discussed with patient certain preventive protocols, quality metrics, and best practice recommendations. A written personalized care plan for preventive services as well as general preventive health recommendations were provided to patient.     Chong Sicilian, RN  06/13/2016   I have reviewed and agree with the above AWV documentation.   Mary-Margaret Hassell Done, FNP

## 2016-06-13 NOTE — Patient Instructions (Signed)
  Sheila Ferrell ,  Thank you for taking time to come for your Medicare Wellness Visit. I appreciate your ongoing commitment to your health goals. Please review the following plan we discussed and let me know if I can assist you in the future.   These are the goals we discussed: Try to walk for 30 minutes a day. Take some time for yourself.    This is a list of the screening recommended for you and due dates:  Health Maintenance  Topic Date Due  . Flu Shot  08/06/2016  . Mammogram  01/01/2017  . Tetanus Vaccine  04/12/2023  . DEXA scan (bone density measurement)  Completed  . Pneumonia vaccines  Completed   Return copy of updated Advance Directive.

## 2016-06-16 LAB — FECAL OCCULT BLOOD, IMMUNOCHEMICAL: Fecal Occult Bld: NEGATIVE

## 2016-09-05 DIAGNOSIS — M25512 Pain in left shoulder: Secondary | ICD-10-CM | POA: Diagnosis not present

## 2016-09-05 DIAGNOSIS — E663 Overweight: Secondary | ICD-10-CM | POA: Insufficient documentation

## 2016-09-09 ENCOUNTER — Encounter: Payer: Self-pay | Admitting: Nurse Practitioner

## 2016-09-09 ENCOUNTER — Ambulatory Visit (INDEPENDENT_AMBULATORY_CARE_PROVIDER_SITE_OTHER): Payer: Medicare Other

## 2016-09-09 ENCOUNTER — Ambulatory Visit (INDEPENDENT_AMBULATORY_CARE_PROVIDER_SITE_OTHER): Payer: Medicare Other | Admitting: Nurse Practitioner

## 2016-09-09 VITALS — BP 122/79 | HR 82 | Temp 97.2°F | Ht 61.0 in | Wt 177.0 lb

## 2016-09-09 DIAGNOSIS — M810 Age-related osteoporosis without current pathological fracture: Secondary | ICD-10-CM

## 2016-09-09 DIAGNOSIS — E034 Atrophy of thyroid (acquired): Secondary | ICD-10-CM | POA: Diagnosis not present

## 2016-09-09 DIAGNOSIS — Z6833 Body mass index (BMI) 33.0-33.9, adult: Secondary | ICD-10-CM | POA: Diagnosis not present

## 2016-09-09 DIAGNOSIS — E559 Vitamin D deficiency, unspecified: Secondary | ICD-10-CM

## 2016-09-09 DIAGNOSIS — F411 Generalized anxiety disorder: Secondary | ICD-10-CM

## 2016-09-09 DIAGNOSIS — E782 Mixed hyperlipidemia: Secondary | ICD-10-CM | POA: Diagnosis not present

## 2016-09-09 DIAGNOSIS — F3342 Major depressive disorder, recurrent, in full remission: Secondary | ICD-10-CM

## 2016-09-09 LAB — CMP14+EGFR
A/G RATIO: 1.9 (ref 1.2–2.2)
ALK PHOS: 99 IU/L (ref 39–117)
ALT: 20 IU/L (ref 0–32)
AST: 16 IU/L (ref 0–40)
Albumin: 4.7 g/dL (ref 3.5–4.8)
BILIRUBIN TOTAL: 0.4 mg/dL (ref 0.0–1.2)
BUN/Creatinine Ratio: 20 (ref 12–28)
BUN: 16 mg/dL (ref 8–27)
CHLORIDE: 100 mmol/L (ref 96–106)
CO2: 28 mmol/L (ref 20–29)
Calcium: 10 mg/dL (ref 8.7–10.3)
Creatinine, Ser: 0.81 mg/dL (ref 0.57–1.00)
GFR calc Af Amer: 82 mL/min/{1.73_m2} (ref >59)
GFR calc non Af Amer: 71 mL/min/{1.73_m2} (ref >59)
GLUCOSE: 86 mg/dL (ref 65–99)
Globulin, Total: 2.5 g/dL (ref 1.5–4.5)
POTASSIUM: 5.1 mmol/L (ref 3.5–5.2)
Sodium: 142 mmol/L (ref 134–144)
Total Protein: 7.2 g/dL (ref 6.0–8.5)

## 2016-09-09 LAB — LIPID PANEL
CHOLESTEROL TOTAL: 122 mg/dL (ref 100–199)
Chol/HDL Ratio: 2.2 ratio (ref 0.0–4.4)
HDL: 55 mg/dL (ref >39)
LDL Calculated: 47 mg/dL (ref 0–99)
Triglycerides: 101 mg/dL (ref 0–149)
VLDL CHOLESTEROL CAL: 20 mg/dL (ref 5–40)

## 2016-09-09 MED ORDER — LEVOTHYROXINE SODIUM 112 MCG PO TABS: 112.0000 ug | ORAL_TABLET | Freq: Every day | ORAL | 1 refills | Status: DC

## 2016-09-09 MED ORDER — ALENDRONATE SODIUM 70 MG PO TABS: ORAL_TABLET | ORAL | 5 refills | Status: DC

## 2016-09-09 NOTE — Patient Instructions (Signed)
Bone Health Bones protect organs, store calcium, and anchor muscles. Good health habits, such as eating nutritious foods and exercising regularly, are important for maintaining healthy bones. They can also help to prevent a condition that causes bones to lose density and become weak and brittle (osteoporosis). Why is bone mass important? Bone mass refers to the amount of bone tissue that you have. The higher your bone mass, the stronger your bones. An important step toward having healthy bones throughout life is to have strong and dense bones during childhood. A young adult who has a high bone mass is more likely to have a high bone mass later in life. Bone mass at its greatest it is called peak bone mass. A large decline in bone mass occurs in older adults. In women, it occurs about the time of menopause. During this time, it is important to practice good health habits, because if more bone is lost than what is replaced, the bones will become less healthy and more likely to break (fracture). If you find that you have a low bone mass, you may be able to prevent osteoporosis or further bone loss by changing your diet and lifestyle. How can I find out if my bone mass is low? Bone mass can be measured with an X-ray test that is called a bone mineral density (BMD) test. This test is recommended for all women who are age 65 or older. It may also be recommended for men who are age 70 or older, or for people who are more likely to develop osteoporosis due to:  Having bones that break easily.  Having a long-term disease that weakens bones, such as kidney disease or rheumatoid arthritis.  Having menopause earlier than normal.  Taking medicine that weakens bones, such as steroids, thyroid hormones, or hormone treatment for breast cancer or prostate cancer.  Smoking.  Drinking three or more alcoholic drinks each day.  What are the nutritional recommendations for healthy bones? To have healthy bones, you  need to get enough of the right minerals and vitamins. Most nutrition experts recommend getting these nutrients from the foods that you eat. Nutritional recommendations vary from person to person. Ask your health care provider what is healthy for you. Here are some general guidelines. Calcium Recommendations Calcium is the most important (essential) mineral for bone health. Most people can get enough calcium from their diet, but supplements may be recommended for people who are at risk for osteoporosis. Good sources of calcium include:  Dairy products, such as low-fat or nonfat milk, cheese, and yogurt.  Dark green leafy vegetables, such as bok choy and broccoli.  Calcium-fortified foods, such as orange juice, cereal, bread, soy beverages, and tofu products.  Nuts, such as almonds.  Follow these recommended amounts for daily calcium intake:  Children, age 1?3: 700 mg.  Children, age 4?8: 1,000 mg.  Children, age 9?13: 1,300 mg.  Teens, age 14?18: 1,300 mg.  Adults, age 19?50: 1,000 mg.  Adults, age 51?70: ? Men: 1,000 mg. ? Women: 1,200 mg.  Adults, age 71 or older: 1,200 mg.  Pregnant and breastfeeding females: ? Teens: 1,300 mg. ? Adults: 1,000 mg.  Vitamin D Recommendations Vitamin D is the most essential vitamin for bone health. It helps the body to absorb calcium. Sunlight stimulates the skin to make vitamin D, so be sure to get enough sunlight. If you live in a cold climate or you do not get outside often, your health care provider may recommend that you take vitamin   D supplements. Good sources of vitamin D in your diet include:  Egg yolks.  Saltwater fish.  Milk and cereal fortified with vitamin D.  Follow these recommended amounts for daily vitamin D intake:  Children and teens, age 1?18: 600 international units.  Adults, age 50 or younger: 400-800 international units.  Adults, age 51 or older: 800-1,000 international units.  Other Nutrients Other nutrients  for bone health include:  Phosphorus. This mineral is found in meat, poultry, dairy foods, nuts, and legumes. The recommended daily intake for adult men and adult women is 700 mg.  Magnesium. This mineral is found in seeds, nuts, dark green vegetables, and legumes. The recommended daily intake for adult men is 400?420 mg. For adult women, it is 310?320 mg.  Vitamin K. This vitamin is found in green leafy vegetables. The recommended daily intake is 120 mg for adult men and 90 mg for adult women.  What type of physical activity is best for building and maintaining healthy bones? Weight-bearing and strength-building activities are important for building and maintaining peak bone mass. Weight-bearing activities cause muscles and bones to work against gravity. Strength-building activities increases muscle strength that supports bones. Weight-bearing and muscle-building activities include:  Walking and hiking.  Jogging and running.  Dancing.  Gym exercises.  Lifting weights.  Tennis and racquetball.  Climbing stairs.  Aerobics.  Adults should get at least 30 minutes of moderate physical activity on most days. Children should get at least 60 minutes of moderate physical activity on most days. Ask your health care provide what type of exercise is best for you. Where can I find more information? For more information, check out the following websites:  National Osteoporosis Foundation: http://nof.org/learn/basics  National Institutes of Health: http://www.niams.nih.gov/Health_Info/Bone/Bone_Health/bone_health_for_life.asp  This information is not intended to replace advice given to you by your health care provider. Make sure you discuss any questions you have with your health care provider. Document Released: 03/15/2003 Document Revised: 07/13/2015 Document Reviewed: 12/28/2013 Elsevier Interactive Patient Education  2018 Elsevier Inc.  

## 2016-09-09 NOTE — Progress Notes (Signed)
 Subjective:    Patient ID: Sheila Ferrell, female    DOB: 07/10/1940, 76 y.o.   MRN: [id number]  HPI Sheila Ferrell is here today for follow up of chronic medical problem.  Outpatient Encounter Prescriptions as of 09/09/2016  Medication Sig  . alendronate (FOSAMAX) 70 MG tablet TAKE 1 TABLET BY MOUTH ONCE A WEEK ON AN EMPTY STOMACH WITH A FULL GLA SS OF WATER AS INSTRUCTED  . atorvastatin (LIPITOR) 20 MG tablet TAKE 1 TABLET DAILY EVERY EVENING AT 6 P.M.  . Cholecalciferol (VITAMIN D-3) 1000 UNITS CAPS Take by mouth daily.  . levothyroxine (SYNTHROID, LEVOTHROID) 112 MCG tablet TAKE 1 TABLET DAILY BEFORE BREAKFAST  . sertraline (ZOLOFT) 100 MG tablet TAKE 1 TABLET DAILY   No facility-administered encounter medications on file as of 09/09/2016.     1. Hypothyroidism due to acquired atrophy of thyroid  No problems that she is aware of  2. Age-related osteoporosis without current pathological fracture Takes fosamax daily   3. Vitamin D deficiency  Takes vitamin d OTC daily  4. Mixed hyperlipidemia  Does not watch diet at all  5. GAD (generalized anxiety disorder)  Does not take any meds- tries to do stress management  6. Recurrent major depressive disorder, in full remission (HCC)  Currently on zoloft that works well for her denies any side efects Depression screen PHQ 2/9 09/09/2016 06/13/2016 06/06/2016 02/28/2016 12/10/2015  Decreased Interest 0 0 1 1 2  Down, Depressed, Hopeless 0 0 1 1 2  PHQ - 2 Score 0 0 2 2 4  Altered sleeping - 2 2 1 2  Tired, decreased energy - 3 3 2 3  Change in appetite - 3 3 1 3  Feeling bad or failure about yourself  - 2 2 1 2  Trouble concentrating - 2 2 0 0  Moving slowly or fidgety/restless - 0 0 0 0  Suicidal thoughts - 0 0 1 2  PHQ-9 Score - 12 14 8 16     7. BMI 33.0-33.9,adult  No recent weight changes    New complaints: None today      Review of Systems  Constitutional: Negative for activity change and appetite change.  HENT:  Negative.   Eyes: Negative for pain.  Respiratory: Negative for shortness of breath.   Cardiovascular: Negative for chest pain, palpitations and leg swelling.  Gastrointestinal: Negative for abdominal pain.  Endocrine: Negative for polydipsia.  Genitourinary: Negative.   Skin: Negative for rash.  Neurological: Negative for dizziness, weakness and headaches.  Hematological: Does not bruise/bleed easily.  Psychiatric/Behavioral: Negative.   All other systems reviewed and are negative.      Objective:   Physical Exam  Constitutional: She is oriented to person, place, and time. She appears well-developed and well-nourished.  HENT:  Nose: Nose normal.  Mouth/Throat: Oropharynx is clear and moist.  Eyes: EOM are normal.  Neck: Trachea normal, normal range of motion and full passive range of motion without pain. Neck supple. No JVD present. Carotid bruit is not present. No thyromegaly present.  Cardiovascular: Normal rate, regular rhythm, normal heart sounds and intact distal pulses.  Exam reveals no gallop and no friction rub.   No murmur heard. Pulmonary/Chest: Effort normal and breath sounds normal.  Abdominal: Soft. Bowel sounds are normal. She exhibits no distension and no mass. There is no tenderness.  Musculoskeletal: Normal range of motion.  Lymphadenopathy:    She has no cervical adenopathy.  Neurological: She is alert and oriented to   person, place, and time. She has normal reflexes.  Skin: Skin is warm and dry.  Psychiatric: She has a normal mood and affect. Her behavior is normal. Judgment and thought content normal.    BP 122/79   Pulse 82   Temp (!) 97.2 F (36.2 C) (Oral)   Ht 5' 1" (1.549 m)   Wt 177 lb (80.3 kg)   BMI 33.44 kg/m      Assessment & Plan:  1. Hypothyroidism due to acquired atrophy of thyroid - levothyroxine (SYNTHROID, LEVOTHROID) 112 MCG tablet; Take 1 tablet (112 mcg total) by mouth daily before breakfast.  Dispense: 90 tablet; Refill: 1  2.  Age-related osteoporosis without current pathological fracture Weight bearing exercises encouraged - alendronate (FOSAMAX) 70 MG tablet; TAKE 1 TABLET BY MOUTH ONCE A WEEK ON AN EMPTY STOMACH WITH A FULL GLA SS OF WATER AS INSTRUCTED  Dispense: 12 tablet; Refill: 5 - DG [facility] DEXA  3. Vitamin D deficiency Continue daily vitamin d   4. Mixed hyperlipidemia Low fat diet - CMP14+EGFR - Lipid panel  5. GAD (generalized anxiety disorder) Stress management  6. Recurrent major depressive disorder, in full remission (HCC) Continue zoloft Stress management  7. BMI 33.0-33.9,adult Discussed diet and exercise for person with BMI >25 Will recheck weight in 3-6 months    Labs pending Health maintenance reviewed Diet and exercise encouraged Continue all meds Follow up  In 3 months   Mary-Margaret Martin, FNP ' 

## 2016-09-30 ENCOUNTER — Ambulatory Visit: Payer: Medicare Other | Admitting: *Deleted

## 2016-10-06 ENCOUNTER — Encounter: Payer: Self-pay | Admitting: *Deleted

## 2016-10-06 ENCOUNTER — Ambulatory Visit (INDEPENDENT_AMBULATORY_CARE_PROVIDER_SITE_OTHER): Payer: Medicare Other | Admitting: *Deleted

## 2016-10-06 VITALS — BP 130/71 | HR 79 | Ht 60.5 in | Wt 173.0 lb

## 2016-10-06 DIAGNOSIS — Z0000 Encounter for general adult medical examination without abnormal findings: Secondary | ICD-10-CM

## 2016-10-06 NOTE — Patient Instructions (Signed)
  Sheila Ferrell ,  Thank you for taking time to come for your Medicare Wellness Visit. I appreciate your ongoing commitment to your health goals. Please review the following plan we discussed and let me know if I can assist you in the future.   These are the goals we discussed: Goals    . Exercise 150 minutes per week (moderate activity)          Walk for 30 minutes daily    . Stress Reduction          Take time for yourself       This is a list of the screening recommended for you and due dates:  Health Maintenance  Topic Date Due  . Flu Shot  04/05/2017*  . Mammogram  01/01/2017  . Tetanus Vaccine  04/12/2023  . DEXA scan (bone density measurement)  Completed  . Pneumonia vaccines  Completed  *Topic was postponed. The date shown is not the original due date.

## 2016-10-06 NOTE — Progress Notes (Addendum)
Subjective:   Sheila Ferrell is a 76 y.o. female who presents for a subsequent Medicare Annual Wellness Visit.   See Initial visit on 06/13/16.  Review of Systems      Cardiac Risk Factors include: advanced age (>38men, >38 women);obesity (BMI >30kg/m2);dyslipidemia  Other systems negative today.     Objective:    Today's Vitals   10/06/16 1516  BP: 130/71  Pulse: 79  Weight: 173 lb (78.5 kg)  Height: 5' 0.5" (1.537 m)   Body mass index is 33.23 kg/m.   Current Medications (verified) Outpatient Encounter Prescriptions as of 10/06/2016  Medication Sig  . alendronate (FOSAMAX) 70 MG tablet TAKE 1 TABLET BY MOUTH ONCE A WEEK ON AN EMPTY STOMACH WITH A FULL GLA SS OF WATER AS INSTRUCTED  . atorvastatin (LIPITOR) 20 MG tablet TAKE 1 TABLET DAILY EVERY EVENING AT 6 P.M.  . Cholecalciferol (VITAMIN D-3) 1000 UNITS CAPS Take by mouth daily.  Marland Kitchen levothyroxine (SYNTHROID, LEVOTHROID) 112 MCG tablet Take 1 tablet (112 mcg total) by mouth daily before breakfast.  . sertraline (ZOLOFT) 100 MG tablet TAKE 1 TABLET DAILY   No facility-administered encounter medications on file as of 10/06/2016.     Allergies (verified) Patient has no known allergies.   History: Past Medical History:  Diagnosis Date  . Allergic rhinitis   . Anxiety   . Depression   . Hyperlipidemia   . Hypothyroidism 12/97  . Impacted cerumen of left ear   . Insomnia   . Osteoporosis   . Postmenopausal 1999  . Thyromegaly 2000   Past Surgical History:  Procedure Laterality Date  . KNEE SURGERY    . NSVD  1963  . NSVD  1968  . NSVD  1971  . NSVD  1977  . TUBAL LIGATION  1977   BILATERAL     Family History  Problem Relation Age of Onset  . Melanoma Brother   . Deep vein thrombosis Brother   . Hypertension Mother   . Stroke Mother   . Diabetes Paternal Grandfather   . Cancer Father        leukemia  . Healthy Daughter   . Healthy Son   . Heart disease Brother   . Healthy Daughter   .  Hypothyroidism Daughter    Social History   Occupational History  . Not on file.   Social History Main Topics  . Smoking status: Former Smoker    Years: 20.00    Types: Cigarettes  . Smokeless tobacco: Never Used  . Alcohol use No  . Drug use: No  . Sexual activity: Not Currently    Tobacco Counseling Counseling given: Not Answered   Activities of Daily Living In your present state of health, do you have any difficulty performing the following activities: 10/06/2016 06/13/2016  Hearing? Y N  Comment some difficulty when in a large crowd -  Vision? N N  Difficulty concentrating or making decisions? N N  Walking or climbing stairs? N N  Dressing or bathing? N N  Doing errands, shopping? N N  Preparing Food and eating ? N N  Using the Toilet? N N  In the past six months, have you accidently leaked urine? N N  Do you have problems with loss of bowel control? N N  Managing your Medications? N N  Managing your Finances? N N  Housekeeping or managing your Housekeeping? N N  Some recent data might be hidden    Immunizations and Health Maintenance Immunization  History  Administered Date(s) Administered  . Influenza, High Dose Seasonal PF 10/15/2014, 10/16/2015  . Influenza,inj,Quad PF,6+ Mos 10/12/2013  . Pneumococcal Conjugate-13 01/10/2013  . Pneumococcal Polysaccharide-23 05/18/2014  . Tdap 04/11/2013   There are no preventive care reminders to display for this patient.  Patient Care Team: [name], FNP as PCP - General (Nurse Practitioner) Melina Schools, OD (Optometry)  Indicate any recent Medical Services you may have received from other than [facility] providers in the past year (date may be approximate).     Assessment:   This is a routine wellness examination for Sheila Ferrell.   Hearing/Vision screen No exam data present  Dietary issues and exercise activities discussed: Current Exercise Habits: The patient does not participate in regular exercise at  present (Active at home and in her yard. Also cleans up for her mother. ), Exercise limited by: None identified  Goals    . Exercise 150 minutes per week (moderate activity)          Walk for 30 minutes daily    . Stress Reduction          Take time for yourself      Depression Screen PHQ 2/9 Scores 10/06/2016 09/09/2016 06/13/2016 06/06/2016 02/28/2016 12/10/2015 06/08/2015  PHQ - 2 Score 2 0 0 2 2 4 1   PHQ- 9 Score 6 - 12 14 8 16  -    Fall Risk Fall Risk  10/06/2016 09/09/2016 06/13/2016 06/06/2016 02/28/2016  Falls in the past year? No No Yes No No    Cognitive Function: MMSE - Mini Mental State Exam 10/06/2016 06/13/2016  Orientation to time 5 5  Orientation to Place 5 5  Registration 3 3  Attention/ Calculation 5 3  Recall 3 3  Language- name 2 objects 2 2  Language- repeat 1 1  Language- follow 3 step command 3 3  Language- read & follow direction 1 1  Write a sentence 1 1  Copy design 0 0  Total score 29 27       Screening Tests Health Maintenance  Topic Date Due  . INFLUENZA VACCINE  04/05/2017 (Originally 08/06/2016)  . MAMMOGRAM  01/01/2017  . TETANUS/TDAP  04/12/2023  . DEXA SCAN  Completed  . PNA vac Low Risk Adult  Completed      Plan:  Keep f/u with [place] with [facility] and take care of yourself See previous recommendations from visit in 06/2016  I have personally reviewed and noted the following in the patient's chart:   . Medical and social history . Use of alcohol, tobacco or illicit drugs  . Current medications and supplements . Functional ability and status . Nutritional status . Physical activity . Advanced directives . List of other physicians . Hospitalizations, surgeries, and ER visits in previous 12 months . Vitals . Screenings to include cognitive, depression, and falls . Referrals and appointments  In addition, I have reviewed and discussed with patient certain preventive protocols, quality metrics, and best practice  recommendations. A written personalized care plan for preventive services as well as general preventive health recommendations were provided to patient.     Chong Sicilian, RN   10/06/2016   I have reviewed and agree with the above AWV documentation.   Evelina Dun, FNP

## 2016-12-03 ENCOUNTER — Other Ambulatory Visit: Payer: Self-pay | Admitting: Nurse Practitioner

## 2016-12-03 DIAGNOSIS — F3342 Major depressive disorder, recurrent, in full remission: Secondary | ICD-10-CM

## 2016-12-12 ENCOUNTER — Encounter: Payer: Self-pay | Admitting: Nurse Practitioner

## 2016-12-12 ENCOUNTER — Ambulatory Visit (INDEPENDENT_AMBULATORY_CARE_PROVIDER_SITE_OTHER): Payer: Medicare Other | Admitting: Nurse Practitioner

## 2016-12-12 VITALS — BP 135/75 | Temp 97.2°F | Ht 60.0 in | Wt 175.0 lb

## 2016-12-12 DIAGNOSIS — E034 Atrophy of thyroid (acquired): Secondary | ICD-10-CM | POA: Diagnosis not present

## 2016-12-12 DIAGNOSIS — E559 Vitamin D deficiency, unspecified: Secondary | ICD-10-CM

## 2016-12-12 DIAGNOSIS — Z6833 Body mass index (BMI) 33.0-33.9, adult: Secondary | ICD-10-CM | POA: Diagnosis not present

## 2016-12-12 DIAGNOSIS — F3342 Major depressive disorder, recurrent, in full remission: Secondary | ICD-10-CM

## 2016-12-12 DIAGNOSIS — M810 Age-related osteoporosis without current pathological fracture: Secondary | ICD-10-CM | POA: Diagnosis not present

## 2016-12-12 DIAGNOSIS — E782 Mixed hyperlipidemia: Secondary | ICD-10-CM

## 2016-12-12 DIAGNOSIS — H6123 Impacted cerumen, bilateral: Secondary | ICD-10-CM | POA: Diagnosis not present

## 2016-12-12 DIAGNOSIS — F411 Generalized anxiety disorder: Secondary | ICD-10-CM

## 2016-12-12 MED ORDER — ATORVASTATIN CALCIUM 20 MG PO TABS: ORAL_TABLET | ORAL | 1 refills | Status: DC

## 2016-12-12 MED ORDER — SERTRALINE HCL 100 MG PO TABS: 100.0000 mg | ORAL_TABLET | Freq: Every day | ORAL | 1 refills | Status: DC

## 2016-12-12 NOTE — Progress Notes (Signed)
Subjective:    Patient ID: Sheila Ferrell, female    DOB: November 29, 1940, 76 y.o.   MRN: [id number]  HPI  Sheila Ferrell is here today for follow up of chronic medical problem.  Outpatient Encounter Medications as of 12/12/2016  Medication Sig  . alendronate (FOSAMAX) 70 MG tablet TAKE 1 TABLET BY MOUTH ONCE A WEEK ON AN EMPTY STOMACH WITH A FULL GLA SS OF WATER AS INSTRUCTED  . atorvastatin (LIPITOR) 20 MG tablet TAKE 1 TABLET DAILY EVERY EVENING AT 6 P.M.  . Cholecalciferol (VITAMIN D-3) 1000 UNITS CAPS Take by mouth daily.  Marland Kitchen levothyroxine (SYNTHROID, LEVOTHROID) 112 MCG tablet Take 1 tablet (112 mcg total) by mouth daily before breakfast.  . sertraline (ZOLOFT) 100 MG tablet TAKE 1 TABLET DAILY     1. Hypothyroidism due to acquired atrophy of thyroid  No problems that she is aware of  2. Age-related osteoporosis without current pathological fracture  Does very little exercise  3. GAD (generalized anxiety disorder)  Stress management works well to keep hr calm  4. Recurrent major depressive disorder, in full remission ([place])  zoloft working well without side effects Depression screen [facility] 2/9 12/12/2016 10/06/2016 09/09/2016  Decreased Interest 1 1 0  Down, Depressed, Hopeless 2 1 0  PHQ - 2 Score 3 2 0  Altered sleeping 2 2 -  Tired, decreased energy 3 2 -  Change in appetite 3 0 -  Feeling bad or failure about yourself  1 0 -  Trouble concentrating 0 0 -  Moving slowly or fidgety/restless 0 0 -  Suicidal thoughts 1 0 -  PHQ-9 Score 13 6 -  Difficult doing work/chores - Somewhat difficult -  Some recent data might be hidden     5. Mixed hyperlipidemia  Tries to watch diet  6. Vitamin D deficiency  Takes vitamin d OTC daily  7. BMI 33.0-33.9,adult  no recent weight changes    New complaints: none  Social history: Lives with husband- has 2 daughters that live out of town    Review of Systems  Constitutional: Negative for activity change and appetite change.    HENT: Negative.   Eyes: Negative for pain.  Respiratory: Negative for shortness of breath.   Cardiovascular: Negative for chest pain, palpitations and leg swelling.  Gastrointestinal: Negative for abdominal pain.  Endocrine: Negative for polydipsia.  Genitourinary: Negative.   Skin: Negative for rash.  Neurological: Negative for dizziness, weakness and headaches.  Hematological: Does not bruise/bleed easily.  Psychiatric/Behavioral: Negative.   All other systems reviewed and are negative.      Objective:   Physical Exam  Constitutional: She is oriented to person, place, and time. She appears well-developed and well-nourished.  HENT:  Nose: Nose normal.  Mouth/Throat: Oropharynx is clear and moist.  bil cerumen impaction   Eyes: EOM are normal.  Neck: Trachea normal, normal range of motion and full passive range of motion without pain. Neck supple. No JVD present. Carotid bruit is not present. No thyromegaly present.  Cardiovascular: Normal rate, regular rhythm, normal heart sounds and intact distal pulses. Exam reveals no gallop and no friction rub.  No murmur heard. Pulmonary/Chest: Effort normal and breath sounds normal.  Abdominal: Soft. Bowel sounds are normal. She exhibits no distension and no mass. There is no tenderness.  Musculoskeletal: Normal range of motion.  Lymphadenopathy:    She has no cervical adenopathy.  Neurological: She is alert and oriented to person, place, and time. She has normal reflexes.  Skin: Skin is warm and dry.  Psychiatric: She has a normal mood and affect. Her behavior is normal. Judgment and thought content normal.   BP 135/75   Temp (!) 97.2 F (36.2 C) (Oral)   Ht 5' (1.524 m)   Wt 175 lb (79.4 kg)   BMI 34.18 kg/m   S/P ear lavage     Assessment & Plan:  1. Hypothyroidism due to acquired atrophy of thyroid  2. Age-related osteoporosis without current pathological fracture Weight bearing exercise encouraged  3. GAD  (generalized anxiety disorder) stress management  4. Recurrent major depressive disorder, in full remission ([place]) - sertraline (ZOLOFT) 100 MG tablet; Take 1 tablet (100 mg total) by mouth daily.  Dispense: 90 tablet; Refill: 1  5. Mixed hyperlipidemia Low fat diet - atorvastatin (LIPITOR) 20 MG tablet; TAKE 1 TABLET DAILY EVERY EVENING AT 6 P.M.  Dispense: 90 tablet; Refill: 1  6. Vitamin D deficiency  7. BMI 33.0-33.9,adult Discussed diet and exercise for person with BMI >25 Will recheck weight in 3-6 months  8. Bilateral impacted cerumen [place] 2-3 x a week in each year    Labs pending Health maintenance reviewed Diet and exercise encouraged Continue all meds Follow up  In 6 months   [place], FNP

## 2016-12-12 NOTE — Progress Notes (Signed)
Fo

## 2016-12-12 NOTE — Addendum Note (Signed)
Addended by: Rolena Infante on: 12/12/2016 04:39 PM   Modules accepted: Orders

## 2016-12-12 NOTE — Patient Instructions (Signed)
Earwax Buildup, Adult The ears produce a substance called earwax that helps keep bacteria out of the ear and protects the skin in the ear canal. Occasionally, earwax can build up in the ear and cause discomfort or hearing loss. What increases the risk? This condition is more likely to develop in people who:  Are female.  Are elderly.  Naturally produce more earwax.  Clean their ears often with cotton swabs.  Use earplugs often.  Use in-ear headphones often.  Wear hearing aids.  Have narrow ear canals.  Have earwax that is overly thick or sticky.  Have eczema.  Are dehydrated.  Have excess hair in the ear canal.  What are the signs or symptoms? Symptoms of this condition include:  Reduced or muffled hearing.  A feeling of fullness in the ear or feeling that the ear is plugged.  Fluid coming from the ear.  Ear pain.  Ear itch.  Ringing in the ear.  Coughing.  An obvious piece of earwax that can be seen inside the ear canal.  How is this diagnosed? This condition may be diagnosed based on:  Your symptoms.  Your medical history.  An ear exam. During the exam, your health care provider will look into your ear with an instrument called an otoscope.  You may have tests, including a hearing test. How is this treated? This condition may be treated by:  Using ear drops to soften the earwax.  Having the earwax removed by a health care provider. The health care provider may: ? Flush the ear with water. ? Use an instrument that has a loop on the end (curette). ? Use a suction device.  Surgery to remove the wax buildup. This may be done in severe cases.  Follow these instructions at home:  Take over-the-counter and prescription medicines only as told by your health care provider.  Do not put any objects, including cotton swabs, into your ear. You can clean the opening of your ear canal with a washcloth or facial tissue.  Follow instructions from your health  care provider about cleaning your ears. Do not over-clean your ears.  Drink enough fluid to keep your urine clear or pale yellow. This will help to thin the earwax.  Keep all follow-up visits as told by your health care provider. If earwax builds up in your ears often or if you use hearing aids, consider seeing your health care provider for routine, preventive ear cleanings. Ask your health care provider how often you should schedule your cleanings.  If you have hearing aids, clean them according to instructions from the manufacturer and your health care provider. Contact a health care provider if:  You have ear pain.  You develop a fever.  You have blood, pus, or other fluid coming from your ear.  You have hearing loss.  You have ringing in your ears that does not go away.  Your symptoms do not improve with treatment.  You feel like the room is spinning (vertigo). Summary  Earwax can build up in the ear and cause discomfort or hearing loss.  The most common symptoms of this condition include reduced or muffled hearing and a feeling of fullness in the ear or feeling that the ear is plugged.  This condition may be diagnosed based on your symptoms, your medical history, and an ear exam.  This condition may be treated by using ear drops to soften the earwax or by having the earwax removed by a health care provider.  Do   not put any objects, including cotton swabs, into your ear. You can clean the opening of your ear canal with a washcloth or facial tissue. This information is not intended to replace advice given to you by your health care provider. Make sure you discuss any questions you have with your health care provider. Document Released: 01/31/2004 Document Revised: 03/05/2016 Document Reviewed: 03/05/2016 Elsevier Interactive Patient Education  2018 [employer]  

## 2016-12-13 LAB — CMP14+EGFR
ALK PHOS: 109 IU/L (ref 39–117)
ALT: 19 IU/L (ref 0–32)
AST: 18 IU/L (ref 0–40)
Albumin/Globulin Ratio: 1.8 (ref 1.2–2.2)
Albumin: 4.6 g/dL (ref 3.5–4.8)
BUN/Creatinine Ratio: 11 — ABNORMAL LOW (ref 12–28)
BUN: 9 mg/dL (ref 8–27)
Bilirubin Total: 0.3 mg/dL (ref 0.0–1.2)
CO2: 27 mmol/L (ref 20–29)
CREATININE: 0.81 mg/dL (ref 0.57–1.00)
Calcium: 9.8 mg/dL (ref 8.7–10.3)
Chloride: 102 mmol/L (ref 96–106)
GFR calc Af Amer: 82 mL/min/{1.73_m2} (ref >59)
GFR calc non Af Amer: 71 mL/min/{1.73_m2} (ref >59)
GLUCOSE: 92 mg/dL (ref 65–99)
Globulin, Total: 2.6 g/dL (ref 1.5–4.5)
Potassium: 5.1 mmol/L (ref 3.5–5.2)
SODIUM: 143 mmol/L (ref 134–144)
Total Protein: 7.2 g/dL (ref 6.0–8.5)

## 2016-12-13 LAB — LIPID PANEL
CHOLESTEROL TOTAL: 133 mg/dL (ref 100–199)
Chol/HDL Ratio: 2.4 ratio (ref 0.0–4.4)
HDL: 56 mg/dL (ref >39)
LDL CALC: 49 mg/dL (ref 0–99)
TRIGLYCERIDES: 138 mg/dL (ref 0–149)
VLDL CHOLESTEROL CAL: 28 mg/dL (ref 5–40)

## 2016-12-25 IMAGING — CR DG CHEST 2V
2 series · 2 of 2 positions shown · non-contrast
Comparison: No prior recent exams available for comparison.

CLINICAL DATA: Hyperlipidemia.  Yearly exam.

EXAM:
CHEST  2 VIEW

[view not recorded (1 of 2)]
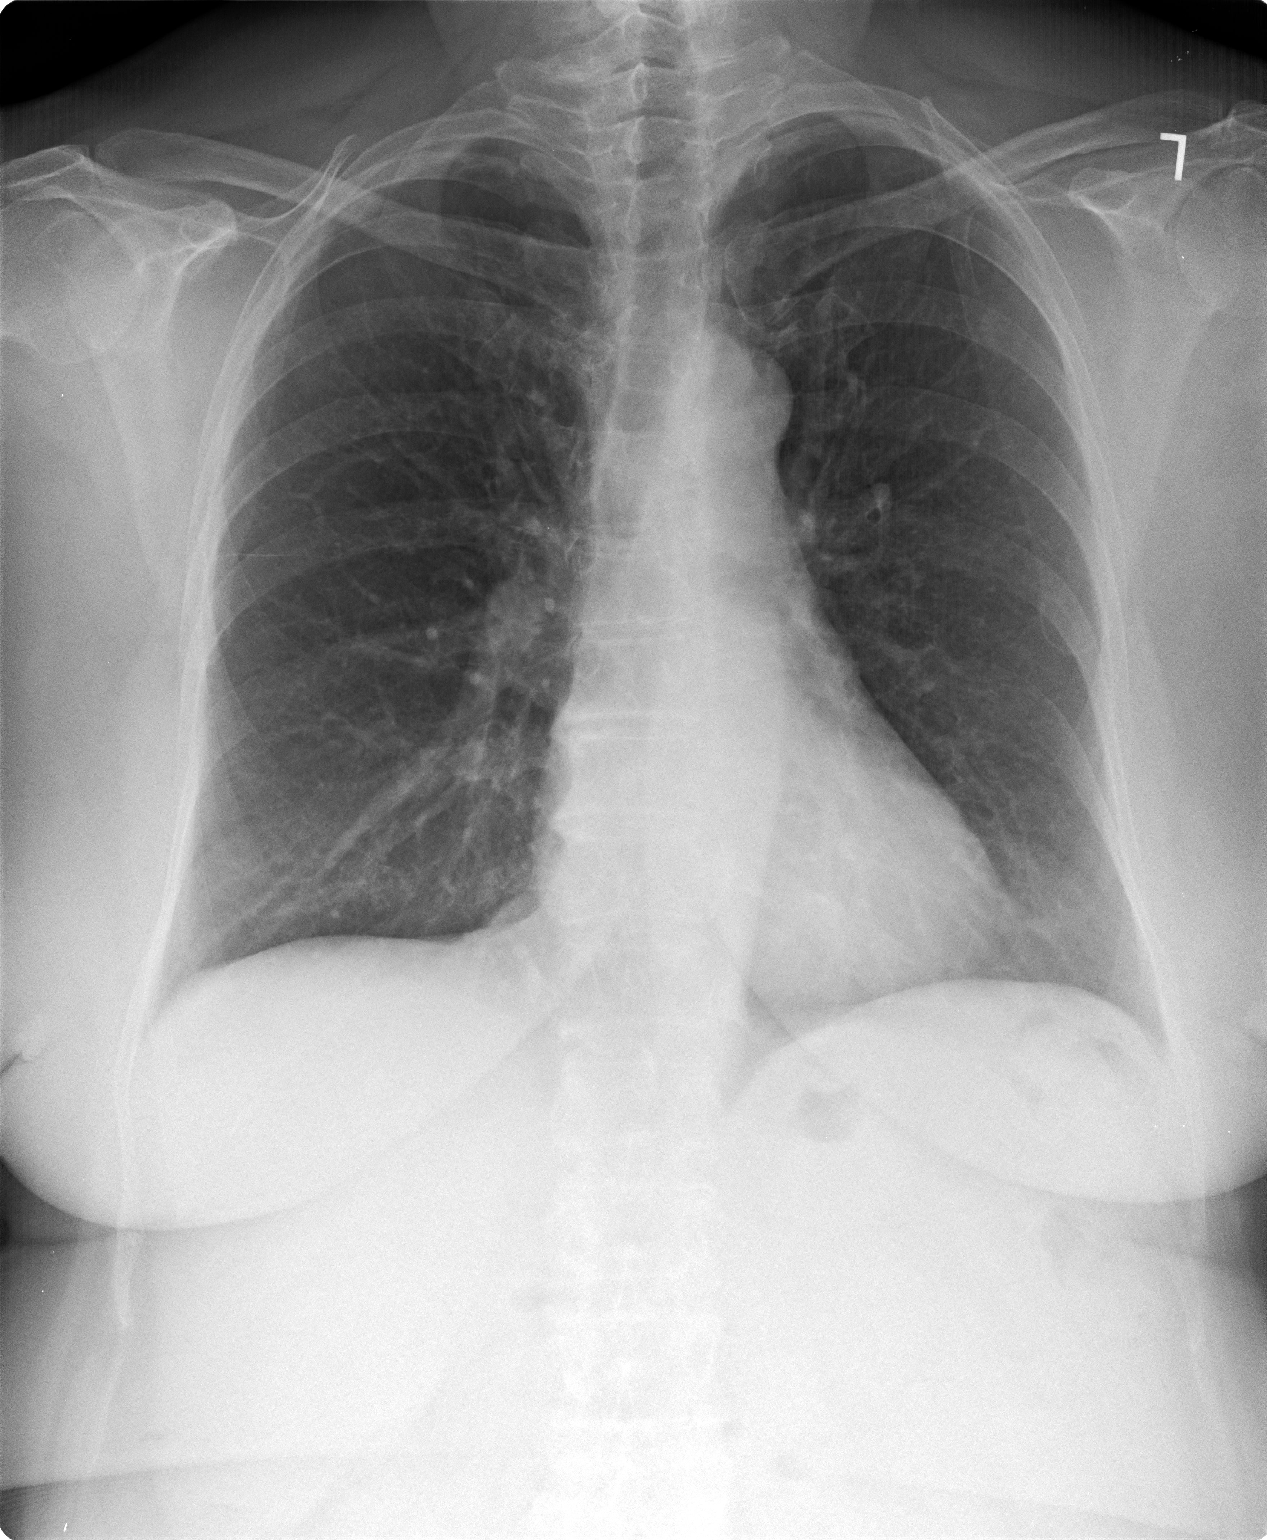

[view not recorded (2 of 2)]
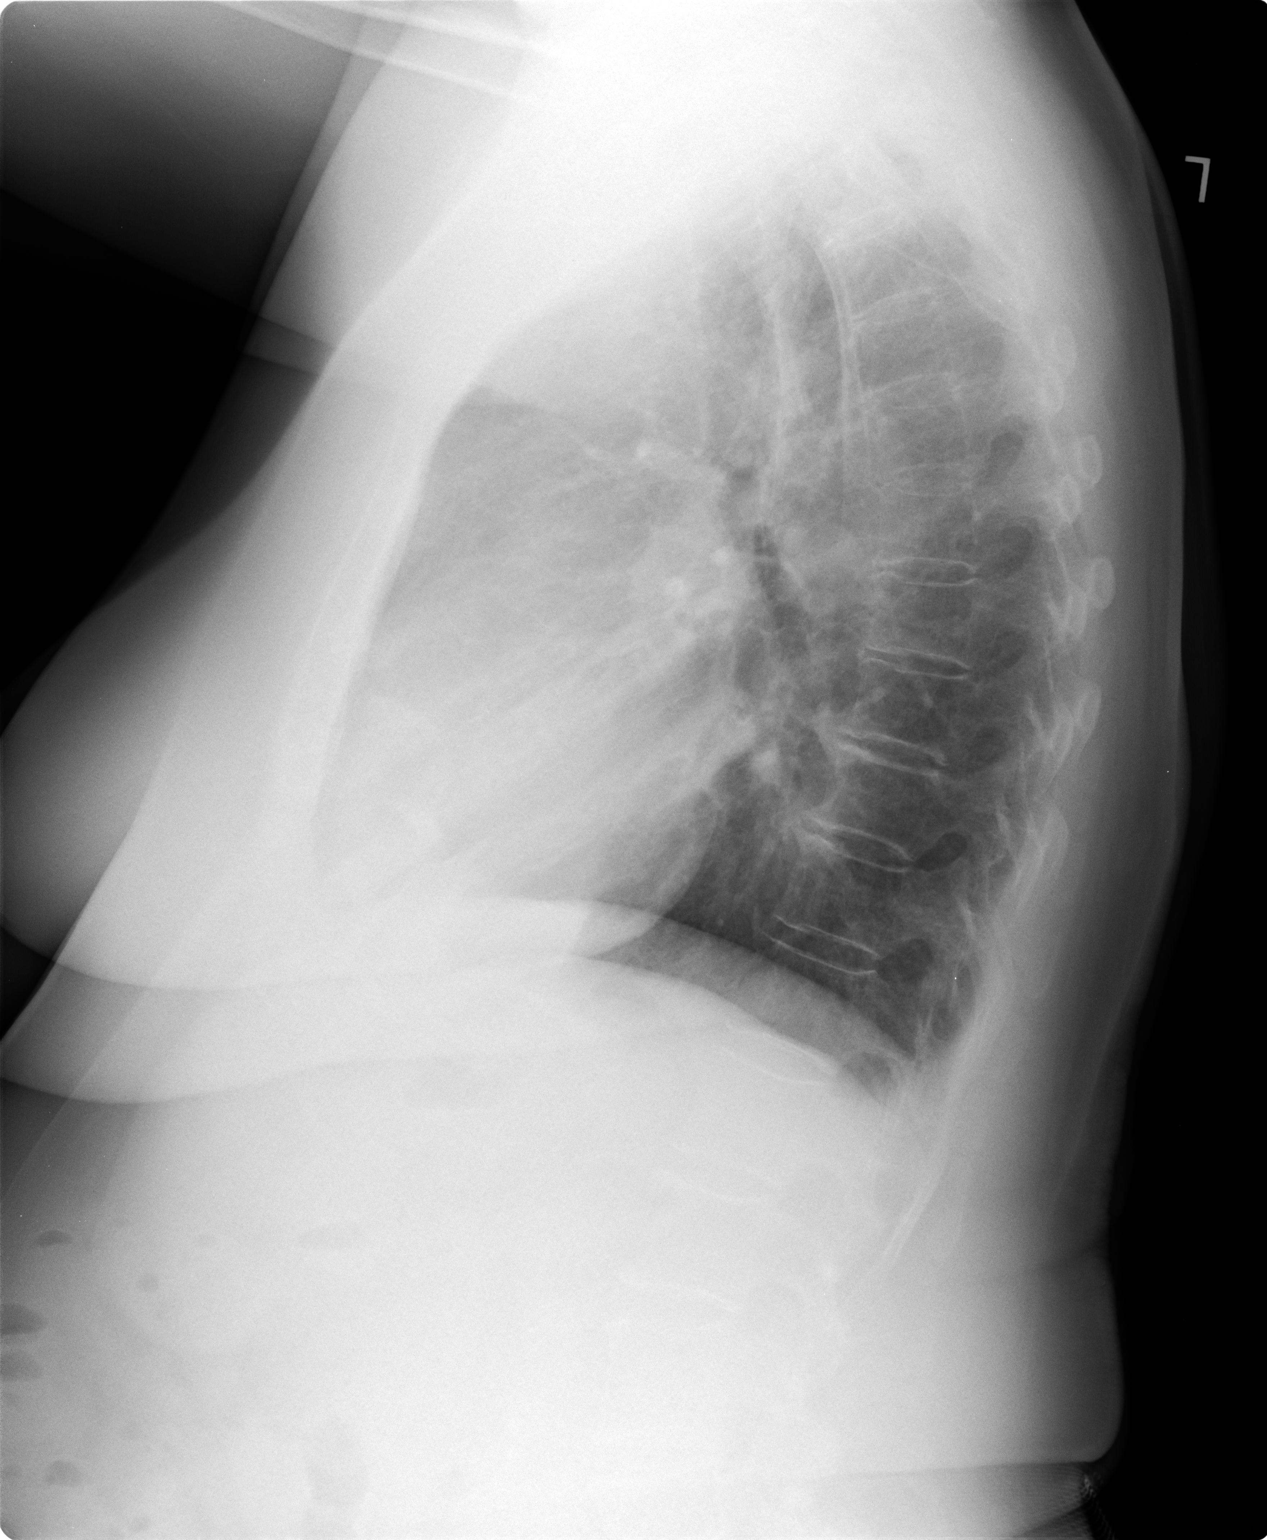

[2 of 2 positions shown; findings below may reference images not displayed]

FINDINGS: Mediastinum hilar structures normal. Lungs are clear. Mild
cardiomegaly, normal pulmonary vascularity. No focal pulmonary
infiltrate . Bilateral pleural parenchymal thickening consistent
with scarring. No pleural effusion or pneumothorax . Degenerative
changes thoracic spine.
IMPRESSION: 1. Mild cardiomegaly.  No CHF.
2. No focal pulmonary infiltrates.

## 2017-01-07 DIAGNOSIS — M9901 Segmental and somatic dysfunction of cervical region: Secondary | ICD-10-CM | POA: Diagnosis not present

## 2017-01-07 DIAGNOSIS — M5412 Radiculopathy, cervical region: Secondary | ICD-10-CM | POA: Diagnosis not present

## 2017-01-08 DIAGNOSIS — M9901 Segmental and somatic dysfunction of cervical region: Secondary | ICD-10-CM | POA: Diagnosis not present

## 2017-01-08 DIAGNOSIS — M5412 Radiculopathy, cervical region: Secondary | ICD-10-CM | POA: Diagnosis not present

## 2017-01-09 ENCOUNTER — Encounter: Payer: Self-pay | Admitting: *Deleted

## 2017-01-12 DIAGNOSIS — M5412 Radiculopathy, cervical region: Secondary | ICD-10-CM | POA: Diagnosis not present

## 2017-01-12 DIAGNOSIS — M9901 Segmental and somatic dysfunction of cervical region: Secondary | ICD-10-CM | POA: Diagnosis not present

## 2017-01-13 DIAGNOSIS — M9901 Segmental and somatic dysfunction of cervical region: Secondary | ICD-10-CM | POA: Diagnosis not present

## 2017-01-13 DIAGNOSIS — M5412 Radiculopathy, cervical region: Secondary | ICD-10-CM | POA: Diagnosis not present

## 2017-01-14 DIAGNOSIS — M9901 Segmental and somatic dysfunction of cervical region: Secondary | ICD-10-CM | POA: Diagnosis not present

## 2017-01-14 DIAGNOSIS — M5412 Radiculopathy, cervical region: Secondary | ICD-10-CM | POA: Diagnosis not present

## 2017-01-21 DIAGNOSIS — M5412 Radiculopathy, cervical region: Secondary | ICD-10-CM | POA: Diagnosis not present

## 2017-01-21 DIAGNOSIS — M9901 Segmental and somatic dysfunction of cervical region: Secondary | ICD-10-CM | POA: Diagnosis not present

## 2017-01-22 DIAGNOSIS — M5412 Radiculopathy, cervical region: Secondary | ICD-10-CM | POA: Diagnosis not present

## 2017-01-22 DIAGNOSIS — M9901 Segmental and somatic dysfunction of cervical region: Secondary | ICD-10-CM | POA: Diagnosis not present

## 2017-01-26 DIAGNOSIS — M9901 Segmental and somatic dysfunction of cervical region: Secondary | ICD-10-CM | POA: Diagnosis not present

## 2017-01-26 DIAGNOSIS — M5412 Radiculopathy, cervical region: Secondary | ICD-10-CM | POA: Diagnosis not present

## 2017-01-28 DIAGNOSIS — M5412 Radiculopathy, cervical region: Secondary | ICD-10-CM | POA: Diagnosis not present

## 2017-01-28 DIAGNOSIS — M9901 Segmental and somatic dysfunction of cervical region: Secondary | ICD-10-CM | POA: Diagnosis not present

## 2017-01-29 DIAGNOSIS — M5412 Radiculopathy, cervical region: Secondary | ICD-10-CM | POA: Diagnosis not present

## 2017-01-29 DIAGNOSIS — M9901 Segmental and somatic dysfunction of cervical region: Secondary | ICD-10-CM | POA: Diagnosis not present

## 2017-01-30 DIAGNOSIS — Z1231 Encounter for screening mammogram for malignant neoplasm of breast: Secondary | ICD-10-CM | POA: Diagnosis not present

## 2017-02-02 DIAGNOSIS — M5412 Radiculopathy, cervical region: Secondary | ICD-10-CM | POA: Diagnosis not present

## 2017-02-02 DIAGNOSIS — M9901 Segmental and somatic dysfunction of cervical region: Secondary | ICD-10-CM | POA: Diagnosis not present

## 2017-02-04 DIAGNOSIS — M5412 Radiculopathy, cervical region: Secondary | ICD-10-CM | POA: Diagnosis not present

## 2017-02-04 DIAGNOSIS — M9901 Segmental and somatic dysfunction of cervical region: Secondary | ICD-10-CM | POA: Diagnosis not present

## 2017-02-05 DIAGNOSIS — M9901 Segmental and somatic dysfunction of cervical region: Secondary | ICD-10-CM | POA: Diagnosis not present

## 2017-02-05 DIAGNOSIS — M5412 Radiculopathy, cervical region: Secondary | ICD-10-CM | POA: Diagnosis not present

## 2017-02-09 DIAGNOSIS — M5412 Radiculopathy, cervical region: Secondary | ICD-10-CM | POA: Diagnosis not present

## 2017-02-09 DIAGNOSIS — M9901 Segmental and somatic dysfunction of cervical region: Secondary | ICD-10-CM | POA: Diagnosis not present

## 2017-02-16 DIAGNOSIS — M5412 Radiculopathy, cervical region: Secondary | ICD-10-CM | POA: Diagnosis not present

## 2017-02-16 DIAGNOSIS — M9901 Segmental and somatic dysfunction of cervical region: Secondary | ICD-10-CM | POA: Diagnosis not present

## 2017-02-23 DIAGNOSIS — M5412 Radiculopathy, cervical region: Secondary | ICD-10-CM | POA: Diagnosis not present

## 2017-02-23 DIAGNOSIS — M9901 Segmental and somatic dysfunction of cervical region: Secondary | ICD-10-CM | POA: Diagnosis not present

## 2017-02-26 DIAGNOSIS — M5412 Radiculopathy, cervical region: Secondary | ICD-10-CM | POA: Diagnosis not present

## 2017-02-26 DIAGNOSIS — M9901 Segmental and somatic dysfunction of cervical region: Secondary | ICD-10-CM | POA: Diagnosis not present

## 2017-03-11 ENCOUNTER — Other Ambulatory Visit: Payer: Self-pay | Admitting: Nurse Practitioner

## 2017-03-11 ENCOUNTER — Telehealth: Payer: Self-pay | Admitting: Nurse Practitioner

## 2017-03-11 DIAGNOSIS — E034 Atrophy of thyroid (acquired): Secondary | ICD-10-CM

## 2017-03-11 DIAGNOSIS — M810 Age-related osteoporosis without current pathological fracture: Secondary | ICD-10-CM

## 2017-03-11 MED ORDER — ALENDRONATE SODIUM 70 MG PO TABS: ORAL_TABLET | ORAL | 5 refills | Status: DC

## 2017-03-11 MED ORDER — LEVOTHYROXINE SODIUM 112 MCG PO TABS
112.0000 ug | ORAL_TABLET | Freq: Every day | ORAL | 1 refills | Status: DC
Start: 2017-03-11 — End: 2017-03-23

## 2017-03-11 NOTE — Telephone Encounter (Signed)
Requesting refill of [facility]othyroxine and [facility].  Patient seen MMM 12/12/16- refills sent.

## 2017-03-12 DIAGNOSIS — M25512 Pain in left shoulder: Secondary | ICD-10-CM | POA: Diagnosis not present

## 2017-03-17 DIAGNOSIS — M19012 Primary osteoarthritis, left shoulder: Secondary | ICD-10-CM | POA: Diagnosis not present

## 2017-03-17 DIAGNOSIS — S46812A Strain of other muscles, fascia and tendons at shoulder and upper arm level, left arm, initial encounter: Secondary | ICD-10-CM | POA: Diagnosis not present

## 2017-03-17 DIAGNOSIS — M25512 Pain in left shoulder: Secondary | ICD-10-CM | POA: Diagnosis not present

## 2017-03-17 DIAGNOSIS — M75122 Complete rotator cuff tear or rupture of left shoulder, not specified as traumatic: Secondary | ICD-10-CM | POA: Diagnosis not present

## 2017-03-23 ENCOUNTER — Other Ambulatory Visit: Payer: Self-pay | Admitting: Nurse Practitioner

## 2017-03-23 DIAGNOSIS — M810 Age-related osteoporosis without current pathological fracture: Secondary | ICD-10-CM

## 2017-03-23 DIAGNOSIS — E034 Atrophy of thyroid (acquired): Secondary | ICD-10-CM

## 2017-03-23 MED ORDER — ALENDRONATE SODIUM 70 MG PO TABS: ORAL_TABLET | ORAL | 5 refills | Status: DC

## 2017-03-23 MED ORDER — LEVOTHYROXINE SODIUM 112 MCG PO TABS: 112.0000 ug | ORAL_TABLET | Freq: Every day | ORAL | 1 refills | Status: DC

## 2017-03-30 DIAGNOSIS — M25512 Pain in left shoulder: Secondary | ICD-10-CM | POA: Diagnosis not present

## 2017-06-05 ENCOUNTER — Encounter: Payer: Self-pay | Admitting: Nurse Practitioner

## 2017-06-05 ENCOUNTER — Ambulatory Visit (INDEPENDENT_AMBULATORY_CARE_PROVIDER_SITE_OTHER): Payer: Medicare Other | Admitting: Nurse Practitioner

## 2017-06-05 VITALS — BP 140/75 | HR 89 | Temp 97.1°F | Ht 60.0 in | Wt 182.0 lb

## 2017-06-05 DIAGNOSIS — F411 Generalized anxiety disorder: Secondary | ICD-10-CM

## 2017-06-05 DIAGNOSIS — E782 Mixed hyperlipidemia: Secondary | ICD-10-CM

## 2017-06-05 DIAGNOSIS — F3342 Major depressive disorder, recurrent, in full remission: Secondary | ICD-10-CM

## 2017-06-05 DIAGNOSIS — M810 Age-related osteoporosis without current pathological fracture: Secondary | ICD-10-CM

## 2017-06-05 DIAGNOSIS — Z6833 Body mass index (BMI) 33.0-33.9, adult: Secondary | ICD-10-CM | POA: Diagnosis not present

## 2017-06-05 DIAGNOSIS — E034 Atrophy of thyroid (acquired): Secondary | ICD-10-CM | POA: Diagnosis not present

## 2017-06-05 DIAGNOSIS — E559 Vitamin D deficiency, unspecified: Secondary | ICD-10-CM | POA: Diagnosis not present

## 2017-06-05 MED ORDER — LEVOTHYROXINE SODIUM 112 MCG PO TABS: 112.0000 ug | ORAL_TABLET | Freq: Every day | ORAL | 1 refills | Status: DC

## 2017-06-05 MED ORDER — SERTRALINE HCL 100 MG PO TABS: 100.0000 mg | ORAL_TABLET | Freq: Every day | ORAL | 1 refills | Status: DC

## 2017-06-05 MED ORDER — ATORVASTATIN CALCIUM 20 MG PO TABS: ORAL_TABLET | ORAL | 1 refills | Status: DC

## 2017-06-05 NOTE — Patient Instructions (Signed)
Stress and Stress Management Stress is a normal reaction to life events. It is what you feel when life demands more than you are used to or more than you can handle. Some stress can be useful. For example, the stress reaction can help you catch the last bus of the day, study for a test, or meet a deadline at work. But stress that occurs too often or for too long can cause problems. It can affect your emotional health and interfere with relationships and normal daily activities. Too much stress can weaken your immune system and increase your risk for physical illness. If you already have a medical problem, stress can make it worse. What are the causes? All sorts of life events may cause stress. An event that causes stress for one person may not be stressful for another person. Major life events commonly cause stress. These may be positive or negative. Examples include losing your job, moving into a new home, getting married, having a baby, or losing a loved one. Less obvious life events may also cause stress, especially if they occur day after day or in combination. Examples include working long hours, driving in traffic, caring for children, being in debt, or being in a difficult relationship. What are the signs or symptoms? Stress may cause emotional symptoms including, the following:  Anxiety. This is feeling worried, afraid, on edge, overwhelmed, or out of control.  Anger. This is feeling irritated or impatient.  Depression. This is feeling sad, down, helpless, or guilty.  Difficulty focusing, remembering, or making decisions.  Stress may cause physical symptoms, including the following:  Aches and pains. These may affect your head, neck, back, stomach, or other areas of your body.  Tight muscles or clenched jaw.  Low energy or trouble sleeping.  Stress may cause unhealthy behaviors, including the following:  Eating to feel better (overeating) or skipping meals.  Sleeping too little,  too much, or both.  Working too much or putting off tasks (procrastination).  Smoking, drinking alcohol, or using drugs to feel better.  How is this diagnosed? Stress is diagnosed through an assessment by your health care provider. Your health care provider will ask questions about your symptoms and any stressful life events.Your health care provider will also ask about your medical history and may order blood tests or other tests. Certain medical conditions and medicine can cause physical symptoms similar to stress. Mental illness can cause emotional symptoms and unhealthy behaviors similar to stress. Your health care provider may refer you to a mental health professional for further evaluation. How is this treated? Stress management is the recommended treatment for stress.The goals of stress management are reducing stressful life events and coping with stress in healthy ways. Techniques for reducing stressful life events include the following:  Stress identification. Self-monitor for stress and identify what causes stress for you. These skills may help you to avoid some stressful events.  Time management. Set your priorities, keep a calendar of events, and learn to say "no." These tools can help you avoid making too many commitments.  Techniques for coping with stress include the following:  Rethinking the problem. Try to think realistically about stressful events rather than ignoring them or overreacting. Try to find the positives in a stressful situation rather than focusing on the negatives.  Exercise. Physical exercise can release both physical and emotional tension. The key is to find a form of exercise you enjoy and do it regularly.  Relaxation techniques. These relax the body and  mind. Examples include yoga, meditation, tai chi, biofeedback, deep breathing, progressive muscle relaxation, listening to music, being out in nature, journaling, and other hobbies. Again, the key is to find  one or more that you enjoy and can do regularly.  Healthy lifestyle. Eat a balanced diet, get plenty of sleep, and do not smoke. Avoid using alcohol or drugs to relax.  Strong support network. Spend time with family, friends, or other people you enjoy being around.Express your feelings and talk things over with someone you trust.  Counseling or talktherapy with a mental health professional may be helpful if you are having difficulty managing stress on your own. Medicine is typically not recommended for the treatment of stress.Talk to your health care provider if you think you need medicine for symptoms of stress. Follow these instructions at home:  Keep all follow-up visits as directed by your health care provider.  Take all medicines as directed by your health care provider. Contact a health care provider if:  Your symptoms get worse or you start having new symptoms.  You feel overwhelmed by your problems and can no longer manage them on your own. Get help right away if:  You feel like hurting yourself or someone else. This information is not intended to replace advice given to you by your health care provider. Make sure you discuss any questions you have with your health care provider. Document Released: 06/18/2000 Document Revised: 05/31/2015 Document Reviewed: 08/17/2012 Elsevier Interactive Patient Education  2017 [employer].

## 2017-06-05 NOTE — Progress Notes (Signed)
Subjective:    Patient ID: Sheila Ferrell, female    DOB: 04-28-1940, 77 y.o.   MRN: [id number]   Chief Complaint: Medical Management of Chronic issues  HPI:  1. Hypothyroidism due to acquired atrophy of thyroid  Not having any problems that she is aware of.   2. Mixed hyperlipidemia  Tries to avoid fried foods  3. Vitamin D deficiency  Takes vitamin d supplement most days  4. Age-related osteoporosis without current pathological fracture  Last dexascan was done on 09/09/16 with t score of -2.2. She denies any back pain. Does not do much weight bearing exercises. On fosamax without side effects  5. GAD (generalized anxiety disorder)  GAD 7 : Generalized Anxiety Score 06/05/2017  Nervous, Anxious, on Edge 0  Control/stop worrying 2  Worry too much - different things 3  Trouble relaxing 2  Restless 1  Easily annoyed or irritable 1  Afraid - awful might happen 0  Total GAD 7 Score 9  Anxiety Difficulty Somewhat difficult     6. Recurrent major depressive disorder, in full remission ([place])  Is currently on zoloft which she says is working well for her without side effects Depression screen [facility] 2/9 06/05/2017 12/12/2016 10/06/2016  Decreased Interest '1 1 1  ' Down, Depressed, Hopeless '1 2 1  ' PHQ - 2 Score '2 3 2  ' Altered sleeping '2 2 2  ' Tired, decreased energy '3 3 2  ' Change in appetite 3 3 0  Feeling bad or failure about yourself  1 1 0  Trouble concentrating 2 0 0  Moving slowly or fidgety/restless 1 0 0  Suicidal thoughts 0 1 0  PHQ-9 Score '14 13 6  ' Difficult doing work/chores - - Somewhat difficult  Some recent data might be hidden     7. BMI 33.0-33.9,adult  No recent weight changes    Outpatient Encounter Medications as of 06/05/2017  Medication Sig  . alendronate (FOSAMAX) 70 MG tablet TAKE 1 TABLET BY MOUTH ONCE A WEEK ON AN EMPTY STOMACH WITH A FULL GLA SS OF WATER AS INSTRUCTED  . atorvastatin (LIPITOR) 20 MG tablet TAKE 1 TABLET DAILY EVERY EVENING AT 6 P.M.  .  Cholecalciferol (VITAMIN D-3) 1000 UNITS CAPS Take by mouth daily.  Marland Kitchen levothyroxine (SYNTHROID, LEVOTHROID) 112 MCG tablet Take 1 tablet (112 mcg total) by mouth daily before breakfast.  . sertraline (ZOLOFT) 100 MG tablet Take 1 tablet (100 mg total) by mouth daily.       New complaints: None today  Social history: Husband and her live alone- her daughters live out of town  Review of Systems  Constitutional: Negative for activity change and appetite change.  HENT: Negative.   Eyes: Negative for pain.  Respiratory: Negative for shortness of breath.   Cardiovascular: Negative for chest pain, palpitations and leg swelling.  Gastrointestinal: Negative for abdominal pain.  Endocrine: Negative for polydipsia.  Genitourinary: Negative.   Skin: Negative for rash.  Neurological: Negative for dizziness, weakness and headaches.  Hematological: Does not bruise/bleed easily.  Psychiatric/Behavioral: Negative.   All other systems reviewed and are negative.      Objective:   Physical Exam  Constitutional: She is oriented to person, place, and time.  HENT:  Head: Normocephalic.  Nose: Nose normal.  Mouth/Throat: Oropharynx is clear and moist.  Eyes: Pupils are equal, round, and reactive to light. EOM are normal.  Neck: Normal range of motion. Neck supple. No JVD present. Carotid bruit is not present.  Cardiovascular: Normal rate, regular  rhythm, normal heart sounds and intact distal pulses.  Pulmonary/Chest: Effort normal and breath sounds normal. No respiratory distress. She has no wheezes. She has no rales. She exhibits no tenderness.  Abdominal: Soft. Normal appearance, normal aorta and bowel sounds are normal. She exhibits no distension, no abdominal bruit, no pulsatile midline mass and no mass. There is no splenomegaly or hepatomegaly. There is no tenderness.  Musculoskeletal: Normal range of motion. She exhibits no edema.  Lymphadenopathy:    She has no cervical adenopathy.    Neurological: She is alert and oriented to person, place, and time. She has normal reflexes.  Skin: Skin is warm and dry.  Psychiatric: Judgment normal.   BP 140/75   Pulse 89   Temp (!) 97.1 F (36.2 C) (Oral)   Ht 5' (1.524 m)   Wt 182 lb (82.6 kg)   BMI 35.54 kg/m        Assessment & Plan:  Sheila Ferrell comes in today with chief complaint of Medical Management of Chronic Issues   Diagnosis and orders addressed:  1. Hypothyroidism due to acquired atrophy of thyroid - levothyroxine (SYNTHROID, LEVOTHROID) 112 MCG tablet; Take 1 tablet (112 mcg total) by mouth daily before breakfast.  Dispense: 90 tablet; Refill: 1 - Thyroid Panel With TSH  2. Mixed hyperlipidemia Low fat diet - atorvastatin (LIPITOR) 20 MG tablet; TAKE 1 TABLET DAILY EVERY EVENING AT 6 P.M.  Dispense: 90 tablet; Refill: 1 - CMP14+EGFR - Lipid panel  3. Vitamin D deficiency Continue daily vitamin d supplement  4. Age-related osteoporosis without current pathological fracture Weight bearing exrcises  5. GAD (generalized anxiety disorder) Stress management  6. Recurrent major depressive disorder, in full remission ([place]) - sertraline (ZOLOFT) 100 MG tablet; Take 1 tablet (100 mg total) by mouth daily.  Dispense: 90 tablet; Refill: 1  7. BMI 33.0-33.9,adult Discussed diet and exercise for person with BMI >25 Will recheck weight in 3-6 months   Labs pending Health Maintenance reviewed Diet and exercise encouraged  Follow up plan: 6 months   Mary-Margaret Hassell Done, FNP

## 2017-06-06 LAB — THYROID PANEL WITH TSH
Free Thyroxine Index: 1.9 (ref 1.2–4.9)
T3 Uptake Ratio: 27 % (ref 24–39)
T4, Total: 7.1 ug/dL (ref 4.5–12.0)
TSH: 1.76 u[IU]/mL (ref 0.450–4.500)

## 2017-06-06 LAB — LIPID PANEL
Chol/HDL Ratio: 2.5 ratio (ref 0.0–4.4)
Cholesterol, Total: 120 mg/dL (ref 100–199)
HDL: 48 mg/dL (ref >39)
LDL Calculated: 47 mg/dL (ref 0–99)
TRIGLYCERIDES: 127 mg/dL (ref 0–149)
VLDL Cholesterol Cal: 25 mg/dL (ref 5–40)

## 2017-06-06 LAB — CMP14+EGFR
ALT: 16 IU/L (ref 0–32)
AST: 17 IU/L (ref 0–40)
Albumin/Globulin Ratio: 2 (ref 1.2–2.2)
Albumin: 4.5 g/dL (ref 3.5–4.8)
Alkaline Phosphatase: 106 IU/L (ref 39–117)
BILIRUBIN TOTAL: 0.5 mg/dL (ref 0.0–1.2)
BUN/Creatinine Ratio: 11 — ABNORMAL LOW (ref 12–28)
BUN: 9 mg/dL (ref 8–27)
CALCIUM: 9.7 mg/dL (ref 8.7–10.3)
CHLORIDE: 102 mmol/L (ref 96–106)
CO2: 25 mmol/L (ref 20–29)
Creatinine, Ser: 0.82 mg/dL (ref 0.57–1.00)
GFR, EST AFRICAN AMERICAN: 80 mL/min/{1.73_m2} (ref >59)
GFR, EST NON AFRICAN AMERICAN: 69 mL/min/{1.73_m2} (ref >59)
GLUCOSE: 92 mg/dL (ref 65–99)
Globulin, Total: 2.3 g/dL (ref 1.5–4.5)
Potassium: 4.3 mmol/L (ref 3.5–5.2)
Sodium: 143 mmol/L (ref 134–144)
TOTAL PROTEIN: 6.8 g/dL (ref 6.0–8.5)

## 2017-06-12 ENCOUNTER — Ambulatory Visit: Payer: Medicare Other | Admitting: Nurse Practitioner

## 2017-08-14 ENCOUNTER — Telehealth: Payer: Self-pay | Admitting: Nurse Practitioner

## 2017-09-10 ENCOUNTER — Ambulatory Visit (INDEPENDENT_AMBULATORY_CARE_PROVIDER_SITE_OTHER): Payer: Medicare Other

## 2017-09-10 VITALS — BP 123/74 | HR 88 | Temp 97.4°F | Ht 60.0 in | Wt 178.0 lb

## 2017-09-10 DIAGNOSIS — Z0000 Encounter for general adult medical examination without abnormal findings: Secondary | ICD-10-CM | POA: Diagnosis not present

## 2017-09-10 NOTE — Progress Notes (Signed)
Subjective:   Sheila Ferrell is a 77 y.o. female who presents for Medicare Annual (Subsequent) preventive examination.  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  She is a full time caregiver for him because of dementia and Parkinson's disease.  They have four children and six grandchildren.    Review of Systems:   Cardiac Risk Factors include: advanced age (>33men, >26 women);dyslipidemia;obesity (BMI >30kg/m2);smoking/ tobacco exposure     Objective:     Vitals: BP 123/74   Pulse 88   Temp (!) 97.4 F (36.3 C) (Oral)   Ht 5' (1.524 m)   Wt 178 lb (80.7 kg)   BMI 34.76 kg/m   Body mass index is 34.76 kg/m.  Advanced Directives 09/10/2017 06/13/2016 02/15/2014  Does Patient Have a Medical Advance Directive? Yes Yes Yes  Type of Paramedic of [place];Living will [place];Living will Living will  Does patient want to make changes to medical advance directive? No - Patient declined Yes (MAU/Ambulatory/Procedural Areas - Information given) -  Copy of [place] in Chart? No - copy requested No - copy requested -    Tobacco Social History   Tobacco Use  Smoking Status Former Smoker  . Packs/day: 1.00  . Years: 20.00  . Pack years: 20.00  . Types: Cigarettes  . Last attempt to quit: 01/07/1992  . Years since quitting: 25.6  Smokeless Tobacco Never Used      Clinical Intake:    Past Medical History:  Diagnosis Date  . Allergic rhinitis   . Anxiety   . Depression   . Hyperlipidemia   . Hypothyroidism 12/97  . Impacted cerumen of left ear   . Insomnia   . Osteoporosis   . Postmenopausal 1999  . Thyromegaly 2000   Past Surgical History:  Procedure Laterality Date  . KNEE SURGERY    . NSVD  1963  . NSVD  1968  . NSVD  1971  . NSVD  1977  . TUBAL LIGATION  1977   BILATERAL     Family History  Problem Relation Age of Onset  . Melanoma Brother   . Deep vein  thrombosis Brother   . Hypertension Mother   . Stroke Mother   . Diabetes Paternal Grandfather   . Cancer Father        leukemia  . Healthy Daughter   . Healthy Son   . Heart disease Brother   . Healthy Daughter   . Hypothyroidism Daughter    Social History   Socioeconomic History  . Marital status: Married    Spouse name: [name]  . Number of children: 4  . Years of education: Not on file  . Highest education level: Not on file  Occupational History  . Not on file  Social Needs  . Financial resource strain: Not hard at all  . Food insecurity:    Worry: Never true    Inability: Never true  . Transportation needs:    Medical: No    Non-medical: No  Tobacco Use  . Smoking status: Former Smoker    Packs/day: 1.00    Years: 20.00    Pack years: 20.00    Types: Cigarettes    Last attempt to quit: 01/07/1992    Years since quitting: 25.6  . Smokeless tobacco: Never Used  Substance and Sexual Activity  . Alcohol use: No  . Drug use: No  .  Sexual activity: Not Currently  Lifestyle  . Physical activity:    Days per week: 0 days    Minutes per session: 0 min  . Stress: To some extent  Relationships  . Social connections:    Talks on phone: More than three times a week    Gets together: Once a week    Attends religious service: Never    Active member of club or organization: No    Attends meetings of clubs or organizations: Never    Relationship status: Married  Other Topics Concern  . Not on file  Social History Narrative  . Not on file    Outpatient Encounter Medications as of 09/10/2017  Medication Sig  . alendronate (FOSAMAX) 70 MG tablet TAKE 1 TABLET BY MOUTH ONCE A WEEK ON AN EMPTY STOMACH WITH A FULL GLA SS OF WATER AS INSTRUCTED  . atorvastatin (LIPITOR) 20 MG tablet TAKE 1 TABLET DAILY EVERY EVENING AT 6 P.M.  . Cholecalciferol (VITAMIN D-3) 1000 UNITS CAPS Take by mouth daily.  Marland Kitchen levothyroxine (SYNTHROID, LEVOTHROID) 112 MCG tablet Take 1  tablet (112 mcg total) by mouth daily before breakfast.  . sertraline (ZOLOFT) 100 MG tablet Take 1 tablet (100 mg total) by mouth daily.   No facility-administered encounter medications on file as of 09/10/2017.     Activities of Daily Living In your present state of health, do you have any difficulty performing the following activities: 09/10/2017 10/06/2016  Hearing? N Y  Comment - some difficulty when in a large crowd  Vision? Y N  Comment Due for eye exam -  Difficulty concentrating or making decisions? N N  Walking or climbing stairs? N N  Dressing or bathing? N N  Doing errands, shopping? N N  Preparing Food and eating ? N N  Using the Toilet? N N  In the past six months, have you accidently leaked urine? N N  Do you have problems with loss of bowel control? N N  Managing your Medications? N N  Managing your Finances? N N  Housekeeping or managing your Housekeeping? N N  Some recent data might be hidden  Patient does report her vision has became blurry and it is time for her to schedule an eye exam.  Patient Care Team: [name], FNP as PCP - General (Nurse Practitioner) Melina Schools, OD (Optometry)    Assessment:   This is a routine wellness examination for November.  Exercise Activities and Dietary recommendations Current Exercise Habits: The patient does not participate in regular exercise at present  Goals    . DIET - INCREASE WATER INTAKE    . Exercise 150 minutes per week (moderate activity)     Walk for 30 minutes daily    . LIFESTYLE - EAT BREAKFAST    . Stress Reduction     Take time for yourself       Fall Risk Fall Risk  09/10/2017 06/05/2017 12/12/2016 10/06/2016 09/09/2016  Falls in the past year? No No No No No   Is the patient's home free of loose throw rugs in walkways, pet beds, electrical cords, etc?   Yes      Grab bars in the bathroom? No      Handrails on the stairs?   Yes      Adequate lighting?   Yes   Depression Screen PHQ 2/9  Scores 09/10/2017 06/05/2017 12/12/2016 10/06/2016  PHQ - 2 Score 2 2 3 2   PHQ- 9 Score 12 14 13  6     Cognitive Function MMSE - Mini Mental State Exam 09/10/2017 10/06/2016 06/13/2016  Orientation to time 5 5 5   Orientation to Place 5 5 5   Registration 3 3 3   Attention/ Calculation 5 5 3   Recall 2 3 3   Language- name 2 objects 2 2 2   Language- repeat 1 1 1   Language- follow 3 step command 3 3 3   Language- read & follow direction 1 1 1   Write a sentence 1 1 1   Copy design 1 0 0  Total score 29 29 27     Patient performed well on the MMSE, scoring 29 out of 30 available points.    Immunization History  Administered Date(s) Administered  . Influenza, High Dose Seasonal PF 10/15/2014, 10/16/2015, 10/11/2016  . Influenza,inj,Quad PF,6+ Mos 10/12/2013  . Pneumococcal Conjugate-13 01/10/2013  . Pneumococcal Polysaccharide-23 05/18/2014  . Tdap 04/11/2013      Screening Tests Health Maintenance  Topic Date Due  . INFLUENZA VACCINE  08/06/2017  . MAMMOGRAM  01/30/2018  . TETANUS/TDAP  04/12/2023  . DEXA SCAN  Completed  . PNA vac Low Risk Adult  Completed    Cancer Screenings: Lung: Low Dose CT Chest recommended if Age 2-80 years, 30 pack-year currently smoking OR have quit w/in 15years. Patient does not qualify. Breast:  Up to date on Mammogram? Yes Up to date of Bone Density/Dexa? Yes Colorectal: No      Plan:     Follow up with [name] for regular visits.    I have personally reviewed and noted the following in the patient's chart:   . Medical and social history . Use of alcohol, tobacco or illicit drugs  . Current medications and supplements . Functional ability and status . Nutritional status . Physical activity . Advanced directives . List of other physicians . Hospitalizations, surgeries, and ER visits in previous 12 months . Vitals . Screenings to include cognitive, depression, and falls . Referrals and appointments  In addition, I have  reviewed and discussed with patient certain preventive protocols, quality metrics, and best practice recommendations. A written personalized care plan for preventive services as well as general preventive health recommendations were provided to patient.     Burnadette Pop, LPN  08/10/6312

## 2017-09-15 DIAGNOSIS — M25512 Pain in left shoulder: Secondary | ICD-10-CM | POA: Diagnosis not present

## 2017-12-07 ENCOUNTER — Encounter: Payer: Self-pay | Admitting: Nurse Practitioner

## 2017-12-07 ENCOUNTER — Ambulatory Visit (INDEPENDENT_AMBULATORY_CARE_PROVIDER_SITE_OTHER): Payer: Medicare Other | Admitting: Nurse Practitioner

## 2017-12-07 VITALS — BP 132/82 | HR 85 | Temp 97.0°F | Ht 60.0 in | Wt 181.0 lb

## 2017-12-07 DIAGNOSIS — F3342 Major depressive disorder, recurrent, in full remission: Secondary | ICD-10-CM

## 2017-12-07 DIAGNOSIS — Z6833 Body mass index (BMI) 33.0-33.9, adult: Secondary | ICD-10-CM | POA: Diagnosis not present

## 2017-12-07 DIAGNOSIS — M810 Age-related osteoporosis without current pathological fracture: Secondary | ICD-10-CM | POA: Diagnosis not present

## 2017-12-07 DIAGNOSIS — E559 Vitamin D deficiency, unspecified: Secondary | ICD-10-CM | POA: Diagnosis not present

## 2017-12-07 DIAGNOSIS — F411 Generalized anxiety disorder: Secondary | ICD-10-CM | POA: Diagnosis not present

## 2017-12-07 DIAGNOSIS — E034 Atrophy of thyroid (acquired): Secondary | ICD-10-CM

## 2017-12-07 DIAGNOSIS — E782 Mixed hyperlipidemia: Secondary | ICD-10-CM | POA: Diagnosis not present

## 2017-12-07 MED ORDER — SERTRALINE HCL 100 MG PO TABS: 100.0000 mg | ORAL_TABLET | Freq: Every day | ORAL | 1 refills | Status: DC

## 2017-12-07 MED ORDER — ATORVASTATIN CALCIUM 20 MG PO TABS: ORAL_TABLET | ORAL | 1 refills | Status: DC

## 2017-12-07 MED ORDER — LEVOTHYROXINE SODIUM 112 MCG PO TABS: 112.0000 ug | ORAL_TABLET | Freq: Every day | ORAL | 1 refills | Status: DC

## 2017-12-07 NOTE — Progress Notes (Signed)
Subjective:    Patient ID: Sheila Ferrell, female    DOB: April 12, 1940, 77 y.o.   MRN: [id number]   Chief Complaint: Medical Management of Chronic Issues (Headaches)   HPI:  1. Hypothyroidism due to acquired atrophy of thyroid  No problems that she is aware of.  2. Age-related osteoporosis without current pathological fracture  Last dexascan was done on 09/09/16 with t score of -2.2. Which really puts her in osteopenic category. She is currently on fosamax weekly  3. BMI 33.0-33.9,adult  Weight is up 3 lbs  4. Recurrent major depressive disorder, in full remission [facility])  She is on zoloft daily.  Depression screen [facility] 2/9 12/07/2017 09/10/2017 06/05/2017  Decreased Interest 0 1 1  Down, Depressed, Hopeless 0 1 1  PHQ - 2 Score 0 2 2  Altered sleeping - 3 2  Tired, decreased energy - 3 3  Change in appetite - 1 3  Feeling bad or failure about yourself  - 2 1  Trouble concentrating - 1 2  Moving slowly or fidgety/restless - 0 1  Suicidal thoughts - 0 0  PHQ-9 Score - 12 14  Difficult doing work/chores - - -  Some recent data might be hidden     5. GAD (generalized anxiety disorder)  Says zoloft works  well for her  6. Mixed hyperlipidemia  Tries to avoid fried foods. Does very little exercise  7. Vitamin D deficiency  Takes a daily vitamin d supplement    Outpatient Encounter Medications as of 12/07/2017  Medication Sig  . alendronate (FOSAMAX) 70 MG tablet TAKE 1 TABLET BY MOUTH ONCE A WEEK ON AN EMPTY STOMACH WITH A FULL GLA SS OF WATER AS INSTRUCTED  . atorvastatin (LIPITOR) 20 MG tablet TAKE 1 TABLET DAILY EVERY EVENING AT 6 P.M.  . Cholecalciferol (VITAMIN D-3) 1000 UNITS CAPS Take by mouth daily.  Marland Kitchen levothyroxine (SYNTHROID, LEVOTHROID) 112 MCG tablet Take 1 tablet (112 mcg total) by mouth daily before breakfast.  . sertraline (ZOLOFT) 100 MG tablet Take 1 tablet (100 mg total) by mouth daily.       New complaints: None today  Social history: Lives with her  husband ho has dementia and parkinsons   Review of Systems  Constitutional: Negative for activity change and appetite change.  HENT: Negative.   Eyes: Negative for pain.  Respiratory: Negative for shortness of breath.   Cardiovascular: Negative for chest pain, palpitations and leg swelling.  Gastrointestinal: Negative for abdominal pain.  Endocrine: Negative for polydipsia.  Genitourinary: Negative.   Skin: Negative for rash.  Neurological: Negative for dizziness, weakness and headaches.  Hematological: Does not bruise/bleed easily.  Psychiatric/Behavioral: Negative.   All other systems reviewed and are negative.      Objective:   Physical Exam  Constitutional: She is oriented to person, place, and time. She appears well-developed and well-nourished. No distress.  HENT:  Head: Normocephalic.  Nose: Nose normal.  Mouth/Throat: Oropharynx is clear and moist.  Eyes: Pupils are equal, round, and reactive to light. EOM are normal.  Neck: Normal range of motion. Neck supple. No JVD present. Carotid bruit is not present.  Cardiovascular: Normal rate, regular rhythm, normal heart sounds and intact distal pulses.  Pulmonary/Chest: Effort normal and breath sounds normal. No respiratory distress. She has no wheezes. She has no rales. She exhibits no tenderness.  Abdominal: Soft. Normal appearance, normal aorta and bowel sounds are normal. She exhibits no distension, no abdominal bruit, no pulsatile midline mass and no  mass. There is no splenomegaly or hepatomegaly. There is no tenderness.  Musculoskeletal: Normal range of motion. She exhibits no edema.  Lymphadenopathy:    She has no cervical adenopathy.  Neurological: She is alert and oriented to person, place, and time. She has normal reflexes.  Skin: Skin is warm and dry.  Psychiatric: She has a normal mood and affect. Her behavior is normal. Judgment and thought content normal.  Nursing note and vitals reviewed.  BP 132/82 (BP  Location: Left Arm, Cuff Size: Normal)   Pulse 85   Temp (!) 97 F (36.1 C) (Oral)   Ht 5' (1.524 m)   Wt 181 lb (82.1 kg)   BMI 35.35 kg/m       Assessment & Plan:  Sheila Ferrell comes in today with chief complaint of Medical Management of Chronic Issues (Headaches)   Diagnosis and orders addressed:  1. Hypothyroidism due to acquired atrophy of thyroid - levothyroxine (SYNTHROID, LEVOTHROID) 112 MCG tablet; Take 1 tablet (112 mcg total) by mouth daily before breakfast.  Dispense: 90 tablet; Refill: 1 - Thyroid Panel With TSH  2. Age-related osteoporosis without current pathological fracture Weight bearing exercises encouraged Stop fosamax for now- has been on for over 8 years  3. BMI 33.0-33.9,adult Discussed diet and exercise for person with BMI >25 Will recheck weight in 3-6 months  4. Recurrent major depressive disorder, in full remission ([place]) Stress management - sertraline (ZOLOFT) 100 MG tablet; Take 1 tablet (100 mg total) by mouth daily.  Dispense: 90 tablet; Refill: 1  5. GAD (generalized anxiety disorder)  6. Mixed hyperlipidemia Low fat diet - atorvastatin (LIPITOR) 20 MG tablet; TAKE 1 TABLET DAILY EVERY EVENING AT 6 P.M.  Dispense: 90 tablet; Refill: 1 - CMP14+EGFR - Lipid panel  7. Vitamin D deficiency Continue vitamin d supplement  Labs pending Health Maintenance reviewed Diet and exercise encouraged  Follow up plan: 6 months   Mary-Margaret Hassell Done, FNP

## 2017-12-07 NOTE — Patient Instructions (Signed)
Bone Health Bones protect organs, store calcium, and anchor muscles. Good health habits, such as eating nutritious foods and exercising regularly, are important for maintaining healthy bones. They can also help to prevent a condition that causes bones to lose density and become weak and brittle (osteoporosis). Why is bone mass important? Bone mass refers to the amount of bone tissue that you have. The higher your bone mass, the stronger your bones. An important step toward having healthy bones throughout life is to have strong and dense bones during childhood. A young adult who has a high bone mass is more likely to have a high bone mass later in life. Bone mass at its greatest it is called peak bone mass. A large decline in bone mass occurs in older adults. In women, it occurs about the time of menopause. During this time, it is important to practice good health habits, because if more bone is lost than what is replaced, the bones will become less healthy and more likely to break (fracture). If you find that you have a low bone mass, you may be able to prevent osteoporosis or further bone loss by changing your diet and lifestyle. How can I find out if my bone mass is low? Bone mass can be measured with an X-ray test that is called a bone mineral density (BMD) test. This test is recommended for all women who are age 65 or older. It may also be recommended for men who are age 70 or older, or for people who are more likely to develop osteoporosis due to:  Having bones that break easily.  Having a long-term disease that weakens bones, such as kidney disease or rheumatoid arthritis.  Having menopause earlier than normal.  Taking medicine that weakens bones, such as steroids, thyroid hormones, or hormone treatment for breast cancer or prostate cancer.  Smoking.  Drinking three or more alcoholic drinks each day.  What are the nutritional recommendations for healthy bones? To have healthy bones, you  need to get enough of the right minerals and vitamins. Most nutrition experts recommend getting these nutrients from the foods that you eat. Nutritional recommendations vary from person to person. Ask your health care provider what is healthy for you. Here are some general guidelines. Calcium Recommendations Calcium is the most important (essential) mineral for bone health. Most people can get enough calcium from their diet, but supplements may be recommended for people who are at risk for osteoporosis. Good sources of calcium include:  Dairy products, such as low-fat or nonfat milk, cheese, and yogurt.  Dark green leafy vegetables, such as bok choy and broccoli.  Calcium-fortified foods, such as orange juice, cereal, bread, soy beverages, and tofu products.  Nuts, such as almonds.  Follow these recommended amounts for daily calcium intake:  Children, age 1?3: 700 mg.  Children, age 4?8: 1,000 mg.  Children, age 9?13: 1,300 mg.  Teens, age 14?18: 1,300 mg.  Adults, age 19?50: 1,000 mg.  Adults, age 51?70: ? Men: 1,000 mg. ? Women: 1,200 mg.  Adults, age 71 or older: 1,200 mg.  Pregnant and breastfeeding females: ? Teens: 1,300 mg. ? Adults: 1,000 mg.  Vitamin D Recommendations Vitamin D is the most essential vitamin for bone health. It helps the body to absorb calcium. Sunlight stimulates the skin to make vitamin D, so be sure to get enough sunlight. If you live in a cold climate or you do not get outside often, your health care provider may recommend that you take vitamin   D supplements. Good sources of vitamin D in your diet include:  Egg yolks.  Saltwater fish.  Milk and cereal fortified with vitamin D.  Follow these recommended amounts for daily vitamin D intake:  Children and teens, age 1?18: 600 international units.  Adults, age 50 or younger: 400-800 international units.  Adults, age 51 or older: 800-1,000 international units.  Other Nutrients Other nutrients  for bone health include:  Phosphorus. This mineral is found in meat, poultry, dairy foods, nuts, and legumes. The recommended daily intake for adult men and adult women is 700 mg.  Magnesium. This mineral is found in seeds, nuts, dark green vegetables, and legumes. The recommended daily intake for adult men is 400?420 mg. For adult women, it is 310?320 mg.  Vitamin K. This vitamin is found in green leafy vegetables. The recommended daily intake is 120 mg for adult men and 90 mg for adult women.  What type of physical activity is best for building and maintaining healthy bones? Weight-bearing and strength-building activities are important for building and maintaining peak bone mass. Weight-bearing activities cause muscles and bones to work against gravity. Strength-building activities increases muscle strength that supports bones. Weight-bearing and muscle-building activities include:  Walking and hiking.  Jogging and running.  Dancing.  Gym exercises.  Lifting weights.  Tennis and racquetball.  Climbing stairs.  Aerobics.  Adults should get at least 30 minutes of moderate physical activity on most days. Children should get at least 60 minutes of moderate physical activity on most days. Ask your health care provide what type of exercise is best for you. Where can I find more information? For more information, check out the following websites:  National Osteoporosis Foundation: http://nof.org/learn/basics  National Institutes of Health: http://www.niams.nih.gov/Health_Info/Bone/Bone_Health/bone_health_for_life.asp  This information is not intended to replace advice given to you by your health care provider. Make sure you discuss any questions you have with your health care provider. Document Released: 03/15/2003 Document Revised: 07/13/2015 Document Reviewed: 12/28/2013 Elsevier Interactive Patient Education  2018 Elsevier Inc.  

## 2017-12-08 LAB — CMP14+EGFR
ALBUMIN: 4.7 g/dL (ref 3.5–4.8)
ALT: 15 IU/L (ref 0–32)
AST: 14 IU/L (ref 0–40)
Albumin/Globulin Ratio: 2.4 — ABNORMAL HIGH (ref 1.2–2.2)
Alkaline Phosphatase: 112 IU/L (ref 39–117)
BUN / CREAT RATIO: 13 (ref 12–28)
BUN: 10 mg/dL (ref 8–27)
Bilirubin Total: 0.5 mg/dL (ref 0.0–1.2)
CALCIUM: 9.8 mg/dL (ref 8.7–10.3)
CHLORIDE: 99 mmol/L (ref 96–106)
CO2: 27 mmol/L (ref 20–29)
Creatinine, Ser: 0.78 mg/dL (ref 0.57–1.00)
GFR calc non Af Amer: 74 mL/min/{1.73_m2} (ref >59)
GFR, EST AFRICAN AMERICAN: 85 mL/min/{1.73_m2} (ref >59)
GLUCOSE: 91 mg/dL (ref 65–99)
Globulin, Total: 2 g/dL (ref 1.5–4.5)
POTASSIUM: 5 mmol/L (ref 3.5–5.2)
Sodium: 143 mmol/L (ref 134–144)
TOTAL PROTEIN: 6.7 g/dL (ref 6.0–8.5)

## 2017-12-08 LAB — LIPID PANEL
Chol/HDL Ratio: 3 ratio (ref 0.0–4.4)
Cholesterol, Total: 143 mg/dL (ref 100–199)
HDL: 47 mg/dL (ref >39)
LDL Calculated: 67 mg/dL (ref 0–99)
Triglycerides: 147 mg/dL (ref 0–149)
VLDL Cholesterol Cal: 29 mg/dL (ref 5–40)

## 2017-12-08 LAB — THYROID PANEL WITH TSH
FREE THYROXINE INDEX: 2.5 (ref 1.2–4.9)
T3 UPTAKE RATIO: 28 % (ref 24–39)
T4 TOTAL: 9 ug/dL (ref 4.5–12.0)
TSH: 0.613 u[IU]/mL (ref 0.450–4.500)

## 2018-01-25 DIAGNOSIS — M25512 Pain in left shoulder: Secondary | ICD-10-CM | POA: Diagnosis not present

## 2018-03-02 DIAGNOSIS — Z1231 Encounter for screening mammogram for malignant neoplasm of breast: Secondary | ICD-10-CM | POA: Diagnosis not present

## 2018-03-02 LAB — HM MAMMOGRAPHY

## 2018-05-13 ENCOUNTER — Other Ambulatory Visit: Payer: Self-pay | Admitting: Nurse Practitioner

## 2018-05-13 DIAGNOSIS — F3342 Major depressive disorder, recurrent, in full remission: Secondary | ICD-10-CM

## 2018-06-07 ENCOUNTER — Other Ambulatory Visit: Payer: Self-pay

## 2018-06-08 ENCOUNTER — Ambulatory Visit (INDEPENDENT_AMBULATORY_CARE_PROVIDER_SITE_OTHER): Payer: Medicare Other | Admitting: Nurse Practitioner

## 2018-06-08 ENCOUNTER — Encounter: Payer: Self-pay | Admitting: Nurse Practitioner

## 2018-06-08 VITALS — BP 143/76 | HR 86 | Temp 97.1°F | Ht 61.0 in | Wt 183.0 lb

## 2018-06-08 DIAGNOSIS — F411 Generalized anxiety disorder: Secondary | ICD-10-CM | POA: Diagnosis not present

## 2018-06-08 DIAGNOSIS — E782 Mixed hyperlipidemia: Secondary | ICD-10-CM | POA: Diagnosis not present

## 2018-06-08 DIAGNOSIS — E559 Vitamin D deficiency, unspecified: Secondary | ICD-10-CM

## 2018-06-08 DIAGNOSIS — E034 Atrophy of thyroid (acquired): Secondary | ICD-10-CM

## 2018-06-08 DIAGNOSIS — F3342 Major depressive disorder, recurrent, in full remission: Secondary | ICD-10-CM | POA: Diagnosis not present

## 2018-06-08 DIAGNOSIS — M810 Age-related osteoporosis without current pathological fracture: Secondary | ICD-10-CM

## 2018-06-08 DIAGNOSIS — Z6833 Body mass index (BMI) 33.0-33.9, adult: Secondary | ICD-10-CM

## 2018-06-08 MED ORDER — LEVOTHYROXINE SODIUM 112 MCG PO TABS: 112.0000 ug | ORAL_TABLET | Freq: Every day | ORAL | 1 refills | Status: DC

## 2018-06-08 MED ORDER — ATORVASTATIN CALCIUM 20 MG PO TABS: ORAL_TABLET | ORAL | 1 refills | Status: DC

## 2018-06-08 MED ORDER — SERTRALINE HCL 100 MG PO TABS: 100.0000 mg | ORAL_TABLET | Freq: Every day | ORAL | 1 refills | Status: DC

## 2018-06-08 NOTE — Patient Instructions (Signed)

## 2018-06-08 NOTE — Progress Notes (Signed)
Subjective:    Patient ID: Sheila Ferrell, female    DOB: [birth year], 78 y.o.   MRN: [id number]   Chief Complaint: Medical Management of Chronic Issues    HPI:  1. Mixed hyperlipidemia Does not watch diet at all. Does no exercise.  2. Hypothyroidism due to acquired atrophy of thyroid No problems that aware of.  3. Recurrent major depressive disorder, in full remission (Sheila Ferrell) Is on zoloft. She says she is not doing as well as she was. Says she feels blah all the time. She says she would like someone to talk to. Depression screen [facility] 2/9 06/08/2018 12/07/2017 09/10/2017  Decreased Interest 1 0 1  Down, Depressed, Hopeless 2 0 1  PHQ - 2 Score 3 0 2  Altered sleeping 2 - 3  Tired, decreased energy 3 - 3  Change in appetite 3 - 1  Feeling bad or failure about yourself  3 - 2  Trouble concentrating 0 - 1  Moving slowly or fidgety/restless 0 - 0  Suicidal thoughts 0 - 0  PHQ-9 Score 14 - 12  Difficult doing work/chores - - -  Some recent data might be hidden     4. 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  5. Age-related osteoporosis without current pathological fracture Last dexascan was 09/09/16 with t score of -2.2  6. Vitamin D deficiency Takes daily vitamin d supplement  7. BMI 33.0-33.9,adult Weight is up 2lbs from last visit    Outpatient Encounter Medications as of 06/08/2018  Medication Sig  . atorvastatin (LIPITOR) 20 MG tablet TAKE 1 TABLET DAILY EVERY EVENING AT 6 P.M.  . Cholecalciferol (VITAMIN D-3) 1000 UNITS CAPS Take by mouth daily.  Marland Kitchen levothyroxine (SYNTHROID, LEVOTHROID) 112 MCG tablet Take 1 tablet (112 mcg total) by mouth daily before breakfast.  . 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     Review of Systems  Constitutional: Negative for activity change and appetite change.  HENT:  Negative.   Eyes: Negative for pain.  Respiratory: Negative for shortness of breath.   Cardiovascular: Negative for chest pain, palpitations and leg swelling.  Gastrointestinal: Negative for abdominal pain.  Endocrine: Negative for polydipsia.  Genitourinary: Negative.   Skin: Negative for rash.  Neurological: Negative for dizziness, weakness and headaches.  Hematological: Does not bruise/bleed easily.  Psychiatric/Behavioral: The patient is nervous/anxious.   All other systems reviewed and are negative.      Objective:   Physical Exam Vitals signs and nursing note reviewed.  Constitutional:      General: She is not in acute distress.    Appearance: Normal appearance. She is well-developed.  HENT:     Head: Normocephalic.     Nose: Nose normal.  Eyes:     Pupils: Pupils are equal, round, and reactive to light.  Neck:     Musculoskeletal: Normal range of motion and neck supple.     Vascular: No carotid bruit or JVD.  Cardiovascular:     Rate and Rhythm: Normal rate and regular rhythm.     Heart sounds: Normal heart sounds.  Pulmonary:     Effort: Pulmonary effort is normal. No respiratory distress.     Breath sounds: Normal breath sounds. No wheezing or rales.  Chest:     Chest wall: No tenderness.  Abdominal:     General: Bowel  sounds are normal. There is no distension or abdominal bruit.     Palpations: Abdomen is soft. There is no hepatomegaly, splenomegaly, mass or pulsatile mass.     Tenderness: There is no abdominal tenderness.  Musculoskeletal: Normal range of motion.  Lymphadenopathy:     Cervical: No cervical adenopathy.  Skin:    General: Skin is warm and dry.  Neurological:     Mental Status: She is alert and oriented to person, place, and time.     Deep Tendon Reflexes: Reflexes are normal and symmetric.  Psychiatric:        Behavior: Behavior normal.        Thought Content: Thought content normal.        Judgment: Judgment normal.    BP (!) 143/76    Pulse 86   Temp (!) 97.1 F (36.2 C) (Oral)   Ht '5\' 1"'  (1.549 m)   Wt 183 lb (83 kg)   BMI 34.58 kg/m         Assessment & Plan:  Sheila Ferrell comes in today with chief complaint of Medical Management of Chronic Issues   Diagnosis and orders addressed:  1. Mixed hyperlipidemia Low fat diet - atorvastatin (LIPITOR) 20 MG tablet; TAKE 1 TABLET DAILY EVERY EVENING AT 6 P.M.  Dispense: 90 tablet; Refill: 1 - CMP14+EGFR - Lipid panel  2. Hypothyroidism due to acquired atrophy of thyroid - levothyroxine (SYNTHROID) 112 MCG tablet; Take 1 tablet (112 mcg total) by mouth daily before breakfast.  Dispense: 90 tablet; Refill: 1 - Thyroid Panel With TSH  3. Recurrent major depressive disorder, in full remission Sheila Ferrell) Stress management Sent message to CCM to help find her some counseling - sertraline (ZOLOFT) 100 MG tablet; Take 1 tablet (100 mg total) by mouth daily.  Dispense: 90 tablet; Refill: 1  4. GAD (generalized anxiety disorder) Stress management  5. Age-related osteoporosis without current pathological fracture Weight bearing exercises  6. Vitamin D deficiency Continue vitamin d supplement  7. BMI 33.0-33.9,adult Discussed diet and exercise for person with BMI >25 Will recheck weight in 3-6 months   Labs pending Health Maintenance reviewed Diet and exercise encouraged  Follow up plan: 6 months   Mary-Margaret Hassell Done, FNP

## 2018-06-09 ENCOUNTER — Ambulatory Visit: Payer: Medicare Other | Admitting: Licensed Clinical Social Worker

## 2018-06-09 DIAGNOSIS — E034 Atrophy of thyroid (acquired): Secondary | ICD-10-CM

## 2018-06-09 DIAGNOSIS — E782 Mixed hyperlipidemia: Secondary | ICD-10-CM

## 2018-06-09 DIAGNOSIS — F411 Generalized anxiety disorder: Secondary | ICD-10-CM

## 2018-06-09 DIAGNOSIS — F3342 Major depressive disorder, recurrent, in full remission: Secondary | ICD-10-CM

## 2018-06-09 DIAGNOSIS — E559 Vitamin D deficiency, unspecified: Secondary | ICD-10-CM

## 2018-06-09 LAB — CMP14+EGFR
ALT: 17 IU/L (ref 0–32)
AST: 17 IU/L (ref 0–40)
Albumin/Globulin Ratio: 2.4 — ABNORMAL HIGH (ref 1.2–2.2)
Albumin: 4.7 g/dL (ref 3.7–4.7)
Alkaline Phosphatase: 110 IU/L (ref 39–117)
BUN/Creatinine Ratio: 16 (ref 12–28)
BUN: 12 mg/dL (ref 8–27)
Bilirubin Total: 0.4 mg/dL (ref 0.0–1.2)
CO2: 27 mmol/L (ref 20–29)
Calcium: 9.8 mg/dL (ref 8.7–10.3)
Chloride: 104 mmol/L (ref 96–106)
Creatinine, Ser: 0.75 mg/dL (ref 0.57–1.00)
GFR calc Af Amer: 88 mL/min/{1.73_m2} (ref >59)
GFR calc non Af Amer: 77 mL/min/{1.73_m2} (ref >59)
Globulin, Total: 2 g/dL (ref 1.5–4.5)
Glucose: 90 mg/dL (ref 65–99)
Potassium: 5 mmol/L (ref 3.5–5.2)
Sodium: 145 mmol/L — ABNORMAL HIGH (ref 134–144)
Total Protein: 6.7 g/dL (ref 6.0–8.5)

## 2018-06-09 LAB — LIPID PANEL
Chol/HDL Ratio: 2.8 ratio (ref 0.0–4.4)
Cholesterol, Total: 139 mg/dL (ref 100–199)
HDL: 50 mg/dL (ref >39)
LDL Calculated: 57 mg/dL (ref 0–99)
Triglycerides: 162 mg/dL — ABNORMAL HIGH (ref 0–149)
VLDL Cholesterol Cal: 32 mg/dL (ref 5–40)

## 2018-06-09 LAB — THYROID PANEL WITH TSH
Free Thyroxine Index: 1.8 (ref 1.2–4.9)
T3 Uptake Ratio: 26 % (ref 24–39)
T4, Total: 7.1 ug/dL (ref 4.5–12.0)
TSH: 0.888 u[IU]/mL (ref 0.450–4.500)

## 2018-06-09 NOTE — Chronic Care Management (AMB) (Addendum)
  Care Management Note   Sheila Ferrell is a 78 y.o. year old female who is a primary care patient of [name], [place]. The CM team was consulted for assistance with chronic disease management and care coordination.   I reached out to [name] by phone today.   Ms. Mares was given information about Chronic Care Management services today including:  1. CCM service includes personalized support from designated clinical staff supervised by her physician, including individualized plan of care and coordination with other care providers 2. 24/7 contact phone numbers for assistance for urgent and routine care needs. 3. Service will only be billed when office clinical staff spend 20 minutes or more in a month to coordinate care. 4. Only one practitioner may furnish and bill the service in a calendar month. 5. The patient may stop CCM services at any time (effective at the end of the month) by phone call to the office staff. 6. The patient will be responsible for cost sharing (co-pay) of up to 20% of the service fee (after annual deductible is met). Patient did not agree to services and wishes to consider information provided before deciding about enrollment in care management services.    Review of patient status, including review of consultants reports, relevant laboratory and other test results, and collaboration with appropriate care team members and the patient's provider was performed as part of comprehensive patient evaluation and provision of chronic care management services.   Follow Up Plan: Client requested that LCSW call her back tomorrow to talk then with client about CCM program services. Thus, at client request, LCSW will call client on 06/10/2018 to discuss CCM program support  Norva Riffle.Ardelle Haliburton MSW, LCSW Licensed Clinical Social Worker [facility] Care Management [phone]  I have reviewed and agree with the above AWV documentation.   Mary-Margaret Hassell Done, FNP

## 2018-06-09 NOTE — Patient Instructions (Addendum)
Licensed Clinical Social Worker Visit Information  Materials provided: No  Sheila Ferrell was given information about Chronic Care Management services today including:  1. CCM service includes personalized support from designated clinical staff supervised by her physician, including individualized plan of care and coordination with other care providers 2. 24/7 contact phone numbers for assistance for urgent and routine care needs. 3. Service will only be billed when office clinical staff spend 20 minutes or more in a month to coordinate care. 4. Only one practitioner may furnish and bill the service in a calendar month. 5. The patient may stop CCM services at any time (effective at the end of the month) by phone call to the office staff. 6. The patient will be responsible for cost sharing (co-pay) of up to 20% of the service fee (after annual deductible is met).  Patient did not agree to services and wishes to consider information provided before deciding about enrollment in care management services.   Follow Up Plan: Client requested that LCSW call her back tomorrow to talk then with client about CCM program services. Thus, at client request, LCSW will call client on 06/10/2018 to discuss CCM program support  The patient verbalized understanding of instructions provided today and declined a print copy of patient instruction materials.   Sheila Ferrell.Ellysia Char MSW, LCSW Licensed Clinical Social Worker [facility] Care Management [phone]

## 2018-06-10 ENCOUNTER — Ambulatory Visit (INDEPENDENT_AMBULATORY_CARE_PROVIDER_SITE_OTHER): Payer: Medicare Other | Admitting: Licensed Clinical Social Worker

## 2018-06-10 DIAGNOSIS — F3342 Major depressive disorder, recurrent, in full remission: Secondary | ICD-10-CM | POA: Diagnosis not present

## 2018-06-10 DIAGNOSIS — E034 Atrophy of thyroid (acquired): Secondary | ICD-10-CM

## 2018-06-10 DIAGNOSIS — E782 Mixed hyperlipidemia: Secondary | ICD-10-CM

## 2018-06-10 DIAGNOSIS — F411 Generalized anxiety disorder: Secondary | ICD-10-CM

## 2018-06-10 DIAGNOSIS — E559 Vitamin D deficiency, unspecified: Secondary | ICD-10-CM

## 2018-06-10 NOTE — Patient Instructions (Signed)
Licensed Clinical Social Worker Visit Information  Goals we discussed today:  Goals Addressed            This Visit's Progress   . Client states she would like to talk with someone about her feelings of sadness and depression (pt-stated)       Current Barriers:  Marland Kitchen Mental Health Challenges . Managing Depression symptoms is challenging  Clinical Social Work Clinical Goal(s):  Marland Kitchen Over the next 30 days, client will work with SW to address concerns related to depression of client and management of depression sympotms  Interventions: . Provided patient with information about CCM program services.  Marland Kitchen LCSW talked with client about health needs of her spouse (spouse has Dementia, Parkinson's Disease) . Talked with client about relaxation techniques of choice (used to enjoy traveling,) . Talked with client about sleeping challenges for client.  . Talked with client about fall potential of her spouse . Talked with client about deep breathing exercises . Talked with client about muscles relaxation technique and creative imagery relaxation technique  Patient Self Care Activities:  . Self administers medications as prescribed . Attends all scheduled provider appointments Performs ADL's independently    Plan:    Client to attend scheduled medical appointments [place] to use relaxation techniques to help her manage symptoms faced. Client to communicare with RNCM as needed to discuss nursing needs of client LCSW to call client in next 2 weeks to talk with client about depression symptoms management  Initial goal documentation        Materials provided: No  Ms. Goda was given information about Chronic Care Management services today including:  1. CCM service includes personalized support from designated clinical staff supervised by her physician, including individualized plan of care and coordination with other care providers 2. 24/7 contact phone numbers for assistance for urgent and  routine care needs. 3. Service will only be billed when office clinical staff spend 20 minutes or more in a month to coordinate care. 4. Only one practitioner may furnish and bill the service in a calendar month. 5. The patient may stop CCM services at any time (effective at the end of the month) by phone call to the office staff. 6. The patient will be responsible for cost sharing (co-pay) of up to 20% of the service fee (after annual deductible is met).  Patient agreed to services and verbal consent obtained.    Follow Up Plan:  LCSW to call client in next 2 weeks to talk with her about depression symptoms management for client  The patient verbalized understanding of instructions provided today and declined a print copy of patient instruction materials.   Norva Riffle.Fanny Agan MSW, LCSW Licensed Clinical Social Worker [facility] Care Management [phone]

## 2018-06-10 NOTE — Chronic Care Management (AMB) (Addendum)
Care Management Note   Sheila Ferrell is a 78 y.o. year old female who is a primary care patient of [name], [place]. The CM team was consulted for assistance with chronic disease management and care coordination.   I reached out to [name] by phone today.   Sheila Ferrell was given information about Chronic Care Management services today including:  1. CCM service includes personalized support from designated clinical staff supervised by her physician, including individualized plan of care and coordination with other care providers 2. 24/7 contact phone numbers for assistance for urgent and routine care needs. 3. Service will only be billed when office clinical staff spend 20 minutes or more in a month to coordinate care. 4. Only one practitioner may furnish and bill the service in a calendar month. 5. The patient may stop CCM services at any time (effective at the end of the month) by phone call to the office staff. 6. The patient will be responsible for cost sharing (co-pay) of up to 20% of the service fee (after annual deductible is met). Patient agreed to services and verbal consent obtained.    Review of patient status, including review of consultants reports, relevant laboratory and other test results, and collaboration with appropriate care team members and the patient's provider was performed as part of comprehensive patient evaluation and provision of chronic care management services.   Social Determinants of Health:Risk for depression; Risk for Stress    Chronic Care Management from 06/09/2018 in [place]  PHQ-9 Total Score  14     GAD 7 : Generalized Anxiety Score 06/09/2018 06/05/2017  Nervous, Anxious, on Edge 0 0  Control/stop worrying 2 2  Worry too much - different things 3 3  Trouble relaxing 2 2  Restless 1 1  Easily annoyed or irritable 1 1  Afraid - awful might happen 0 0  Total GAD 7 Score 9 9  Anxiety Difficulty Somewhat  difficult Somewhat difficult    Goals Addressed            This Visit's Progress   . Sheila Ferrell states she would like to talk with someone about her feelings of sadness and depression (pt-stated)       Current Barriers:  Marland Kitchen Mental Health Challenges . Managing Depression symptoms is challenging  Clinical Social Work Clinical Goal(s):  Marland Kitchen Over the next 30 days, Sheila Ferrell will work with SW to address concerns related to depression of Sheila Ferrell and management of depression sympotms  Interventions: . Provided patient with information about CCM program services.  Marland Kitchen LCSW talked with Sheila Ferrell about health needs of her spouse (spouse has Dementia, Parkinson's Disease) . Talked with Sheila Ferrell about relaxation techniques of choice (used to enjoy traveling,) . Talked with Sheila Ferrell about sleeping challenges for Sheila Ferrell.  . Talked with Sheila Ferrell about fall potential of her spouse . Talked with Sheila Ferrell about deep breathing exercises . Talked with Sheila Ferrell about muscles relaxation technique and creative imagery relaxation technique . Encouraged Sheila Ferrell to talk with [facility] about nursing needs of Sheila Ferrell  Patient Self Care Activities:  . Self administers medications as prescribed . Attends all scheduled provider appointments  .  Performs ADL's independently    Plan:    Sheila Ferrell to attend scheduled medical appointments Sheila Ferrell to use relaxation techniques to help her manage symptoms faced. Sheila Ferrell to communicare with RNCM as needed to discuss nursing needs of client LCSW to call Sheila Ferrell in next 2 weeks to discuss depression symptoms management for Sheila Ferrell  Initial goal documentation     Sheila Ferrell said she has main caregiving responsibility for her spouse and also has  some caregiving repsonsbility for her 1 year old mother.Sheila Ferrell said her spouse has Dementia and Parkinson's Disease and is 100% Disabled [place].  She said she has talked with VA representative about [place] assistance for her spouse. She said VA is in process of  assigning a Education officer, museum to assist with needs of her spouse .LCSW talked with Sheila Ferrell about VA social worker assistance for Sheila Ferrell's spouse and also about the role of the [place] Eligibility Specialist related to her spouse's needs. Sheila Ferrell has no ambulation difficulty and has no transport needs. She said she is eating adequately.  She spoke of occasional left shoulder pain issue. She said she is fatigued often.  She said her spouse is dependent with ADLs.  She helps spouse in dressing and bathing assistance. Sheila Ferrell does all meal preparation and food procurement.  LCSW talked with Sheila Ferrell about CCM program support. LCSW talked with Sheila Ferrell about relaxation techniques of help possibly (deep breathing, muscle tensing and relaxing, creative imagery). LCSW encouraged Sheila Ferrell to talk with RNCM related to nursing needs of Sheila Ferrell.  LCSW gave Sheila Ferrell the LCSW phone contact number [phone]). LCSW encouraged Kristen Fromm to call LCSW as needed for social work support  Follow Up Plan: LCSW to call Sheila Ferrell in next 2 weeks to talk with Sheila Ferrell about depression issues and depression management for Sheila Ferrell  Norva Riffle.Sylis Ketchum MSW, LCSW Licensed Clinical Social Worker [place] [facility] [phone]  "I have reviewed this encounter including the documentation in this note and/or discussed this patient with Doylene Canard . I am certifying that I agree with the content of this note as supervising physician." [place], FNP

## 2018-06-17 ENCOUNTER — Ambulatory Visit: Payer: Medicare Other | Admitting: Licensed Clinical Social Worker

## 2018-06-17 ENCOUNTER — Encounter: Payer: Self-pay | Admitting: Licensed Clinical Social Worker

## 2018-06-17 DIAGNOSIS — F411 Generalized anxiety disorder: Secondary | ICD-10-CM

## 2018-06-17 DIAGNOSIS — E782 Mixed hyperlipidemia: Secondary | ICD-10-CM

## 2018-06-17 DIAGNOSIS — E559 Vitamin D deficiency, unspecified: Secondary | ICD-10-CM

## 2018-06-17 DIAGNOSIS — F3342 Major depressive disorder, recurrent, in full remission: Secondary | ICD-10-CM

## 2018-06-17 DIAGNOSIS — E034 Atrophy of thyroid (acquired): Secondary | ICD-10-CM

## 2018-06-17 NOTE — Chronic Care Management (AMB) (Addendum)
Chronic Care Management    Clinical Social Work CCM Outreach Note  06/17/2018 Name: Sheila Ferrell MRN: [id number] DOB: 01-09-1940  Sheila Ferrell is a 78 y.o. year old female who is a primary care patient of [name], [place] . The CCM team was consulted for assistance with assessment of psychosocial needs of client.   LCSW reached out to [employer] today.by phone .  Social Determinants of Health:Risk for depression; Risk for Stress    Chronic Care Management from 06/09/2018 in [place]  PHQ-9 Total Score  14     GAD 7 : Generalized Anxiety Score 06/09/2018 06/05/2017  Nervous, Anxious, on Edge 0 0  Control/stop worrying 2 2  Worry too much - different things 3 3  Trouble relaxing 2 2  Restless 1 1  Easily annoyed or irritable 1 1  Afraid - awful might happen 0 0  Total GAD 7 Score 9 9  Anxiety Difficulty Somewhat difficult Somewhat difficult               Goals Addressed                           . Client states she would like to talk with someone about her feelings of sadness and depression (pt-stated)        Current Barriers:   Mental Health Challenges  Managing Depression symptoms is challenging  Clinical Social Work Clinical Goal(s):   Over the next 30 days, client will work with SW to address concerns related to depression of client and management of depression sympotms  Interventions:  LCSW talked with client about health needs of her spouse (spouse has Dementia, Parkinson's Disease)  Talked with client about relaxation techniques of choice (used to enjoy traveling,)  Talked with client about fall potential of her spouse  Talked with client about deep breathing exercises  Talked with client about muscles relaxation technique and creative imagery relaxation technique  Encouraged client to talk with RNCM about nursing needs of client  Patient Self Care Activities:   Self administers  medications as prescribed  Attends all scheduled provider appointments    Performs ADL's independently    Plan:    Client to attend scheduled medical appointments Client to use relaxation techniques to help her manage symptoms faced. Client to communicare with RNCM as needed to discuss nursing needs of client LCSW to call client in next 3 weeks to discuss depression symptoms management for client   Initial goal documentation     Client said she has main caregiving responsibility for her spouse and also has  some caregiving repsonsbility for her 86 year old mother.Client said her spouse has Dementia and Parkinson's Disease and is 100% Disabled [place].  She said she has talked with VA representative about [place] assistance for her spouse. She said VA is in process of assigning a Education officer, museum to assist with needs of her spouse .LCSW talked with client about VA social worker assistance for client's spouse and also about the role of the [place] Eligibility Specialist related to her spouse's needs. Client has no ambulation difficulty and has no transport needs. She said she is eating adequately.  She spoke of occasional left shoulder pain issue. She said she is fatigued often.  She said her spouse is dependent with ADLs.  She helps spouse in dressing and bathing assistance. Client does all meal preparation and food procurement.  LCSW talked with client about  CCM program support. LCSW talked with client about relaxation techniques of help possibly (deep breathing, muscle tensing and relaxing, creative imagery). LCSW encouraged client to talk with RNCM related to nursing needs of client.  LCSW gave client the LCSW phone contact number [phone]). LCSW encouraged Sheila Ferrell to call LCSW as needed for social work support  Follow Up Plan: LCSW to call client in next 3 weeks to talk with client about depression issues and depression management for client  Sheila Ferrell.Sheila Ferrell MSW, LCSW Licensed Clinical  Social Worker [facility] [phone]  "I have reviewed this encounter including the documentation in this note and/or discussed this patient with Doylene Canard . I am certifying that I agree with the content of this note as supervising physician." [place], FNP

## 2018-06-17 NOTE — Patient Instructions (Signed)
Licensed Clinical Social Worker Visit Information  Goals we discussed today:  Goals Addressed            This Visit's Progress   . Client states she would like to talk with someone about her feelings of sadness and depression (pt-stated)       Current Barriers:  Marland Kitchen Mental Health Challenges . Managing Depression symptoms is challenging  Clinical Social Work Clinical Goal(s):  Marland Kitchen Over the next 30 days, client will work with LCSW to address concerns related to depression of client and management of depression symptoms  Interventions: . LCSW talked with client about health needs of her spouse (spouse has Dementia, Parkinson's Disease) . Talked with client about relaxation techniques of choice (used to enjoy traveling,) . Talked with client about sleeping challenges for client.  . Talked with client about fall potential of her spouse . Talked with client about deep breathing exercises . Talked with client about muscles relaxation technique and creative imagery relaxation technique  Patient Self Care Activities:  . Self administers medications as prescribed . Attends all scheduled provider appointments .  Performs ADL's independently    Plan:    Client to attend scheduled medical appointments [place] to use relaxation techniques to help her manage symptoms faced. Client to communicare with RNCM as needed to discuss nursing needs of client LCSW to call client in next 3 weeks to assess psychosocial needs of client  Initial goal documentation     Materials Provided: No  Follow Up Plan: LCSW to call client in next 3 weeks to assess psychosocial needs of client  The patient verbalized understanding of instructions provided today and declined a print copy of patient instruction materials.   Norva Riffle.Laqueena Hinchey MSW, LCSW Licensed Clinical Social Worker [facility] Care Management [phone]

## 2018-06-23 NOTE — Progress Notes (Signed)
Brigid Re,  I read your message and reviewed Ms. Crunkleton's notes. I have put her on my schedule to call.    Thank you for the information.    Cyril Mourning

## 2018-07-01 ENCOUNTER — Telehealth: Payer: Medicare Other

## 2018-09-08 DIAGNOSIS — H43813 Vitreous degeneration, bilateral: Secondary | ICD-10-CM | POA: Diagnosis not present

## 2018-09-08 DIAGNOSIS — H26493 Other secondary cataract, bilateral: Secondary | ICD-10-CM | POA: Diagnosis not present

## 2018-09-08 DIAGNOSIS — Z961 Presence of intraocular lens: Secondary | ICD-10-CM | POA: Diagnosis not present

## 2018-09-14 ENCOUNTER — Ambulatory Visit (INDEPENDENT_AMBULATORY_CARE_PROVIDER_SITE_OTHER): Payer: Medicare Other | Admitting: *Deleted

## 2018-09-14 DIAGNOSIS — Z0000 Encounter for general adult medical examination without abnormal findings: Secondary | ICD-10-CM

## 2018-09-14 NOTE — Patient Instructions (Signed)
Preventive Care 38 Years and Older, Female Preventive care refers to lifestyle choices and visits with your health care provider that can promote health and wellness. This includes:  A yearly physical exam. This is also called an annual well check.  Regular dental and eye exams.  Immunizations.  Screening for certain conditions.  Healthy lifestyle choices, such as diet and exercise. What can I expect for my preventive care visit? Physical exam Your health care provider will check:  Height and weight. These may be used to calculate body mass index (BMI), which is a measurement that tells if you are at a healthy weight.  Heart rate and blood pressure.  Your skin for abnormal spots. Counseling Your health care provider may ask you questions about:  Alcohol, tobacco, and drug use.  Emotional well-being.  Home and relationship well-being.  Sexual activity.  Eating habits.  History of falls.  Memory and ability to understand (cognition).  Work and work Statistician.  Pregnancy and menstrual history. What immunizations do I need?  Influenza (flu) vaccine  This is recommended every year. Tetanus, diphtheria, and pertussis (Tdap) vaccine  You may need a Td booster every 10 years. Varicella (chickenpox) vaccine  You may need this vaccine if you have not already been vaccinated. Zoster (shingles) vaccine  You may need this after age 33. Pneumococcal conjugate (PCV13) vaccine  One dose is recommended after age 33. Pneumococcal polysaccharide (PPSV23) vaccine  One dose is recommended after age 72. Measles, mumps, and rubella (MMR) vaccine  You may need at least one dose of MMR if you were born in 1957 or later. You may also need a second dose. Meningococcal conjugate (MenACWY) vaccine  You may need this if you have certain conditions. Hepatitis A vaccine  You may need this if you have certain conditions or if you travel or work in places where you may be exposed  to hepatitis A. Hepatitis B vaccine  You may need this if you have certain conditions or if you travel or work in places where you may be exposed to hepatitis B. Haemophilus influenzae type b (Hib) vaccine  You may need this if you have certain conditions. You may receive vaccines as individual doses or as more than one vaccine together in one shot (combination vaccines). Talk with your health care provider about the risks and benefits of combination vaccines. What tests do I need? Blood tests  Lipid and cholesterol levels. These may be checked every 5 years, or more frequently depending on your overall health.  Hepatitis C test.  Hepatitis B test. Screening  Lung cancer screening. You may have this screening every year starting at age 39 if you have a 30-pack-year history of smoking and currently smoke or have quit within the past 15 years.  Colorectal cancer screening. All adults should have this screening starting at age 36 and continuing until age 15. Your health care provider may recommend screening at age 23 if you are at increased risk. You will have tests every 1-10 years, depending on your results and the type of screening test.  Diabetes screening. This is done by checking your blood sugar (glucose) after you have not eaten for a while (fasting). You may have this done every 1-3 years.  Mammogram. This may be done every 1-2 years. Talk with your health care provider about how often you should have regular mammograms.  BRCA-related cancer screening. This may be done if you have a family history of breast, ovarian, tubal, or peritoneal cancers.  Other tests  Sexually transmitted disease (STD) testing.  Bone density scan. This is done to screen for osteoporosis. You may have this done starting at age 76. Follow these instructions at home: Eating and drinking  Eat a diet that includes fresh fruits and vegetables, whole grains, lean protein, and low-fat dairy products. Limit  your intake of foods with high amounts of sugar, saturated fats, and salt.  Take vitamin and mineral supplements as recommended by your health care provider.  Do not drink alcohol if your health care provider tells you not to drink.  If you drink alcohol: ? Limit how much you have to 0-1 drink a day. ? Be aware of how much alcohol is in your drink. In the [place] one drink equals one 12 oz bottle of beer (355 mL), one 5 oz glass of wine (148 mL), or one 1 oz glass of hard liquor (44 mL). Lifestyle  Take daily care of your teeth and gums.  Stay active. Exercise for at least 30 minutes on 5 or more days each week.  Do not use any products that contain nicotine or tobacco, such as cigarettes, e-cigarettes, and chewing tobacco. If you need help quitting, ask your health care provider.  If you are sexually active, practice safe sex. Use a condom or other form of protection in order to prevent STIs (sexually transmitted infections).  Talk with your health care provider about taking a low-dose aspirin or statin. What's next?  Go to your health care provider once a year for a well check visit.  Ask your health care provider how often you should have your eyes and teeth checked.  Stay up to date on all vaccines. This information is not intended to replace advice given to you by your health care provider. Make sure you discuss any questions you have with your health care provider. Document Released: 01/19/2015 Document Revised: 12/17/2017 Document Reviewed: 12/17/2017 Elsevier Patient Education  2020 [employer].

## 2018-09-14 NOTE — Progress Notes (Addendum)
MEDICARE ANNUAL WELLNESS VISIT  09/14/2018  Telephone Visit Disclaimer This Medicare AWV was conducted by telephone due to national recommendations for restrictions regarding the COVID-19 Pandemic (e.g. social distancing).  I verified, using two identifiers, that I am speaking with Sheila Ferrell or their authorized healthcare agent. I discussed the limitations, risks, security, and privacy concerns of performing an evaluation and management service by telephone and the potential availability of an in-person appointment in the future. The patient expressed understanding and agreed to proceed.   Subjective:  Sheila Ferrell is a 78 y.o. female patient of [name], [place] who had a Medicare Annual Wellness Visit today via telephone. 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 she has 4 children. she reports that she is socially active and does interact with friends/family regularly. she is moderately physically active and enjoys playing solitaire.  Patient Care Team: [name], FNP as PCP - General (Nurse Practitioner) Melina Schools, OD (Optometry) Shea Evans Norva Riffle, LCSW as Social Worker (Licensed Clinical Social Worker)  Advanced Directives 09/14/2018 09/10/2017 06/13/2016 02/15/2014  Does Patient Have a Medical Advance Directive? Yes Yes Yes Yes  Type of Paramedic of [place];Living will [place];Living will [place];Living will Living will  Does patient want to make changes to medical advance directive? No - Patient declined No - Patient declined Yes (MAU/Ambulatory/Procedural Areas - Information given) -  Copy of [place] in Chart? No - copy requested No - copy requested No - copy requested [facility] Utilization Over the Past 12 Months: # of hospitalizations or ER visits: 0 # of surgeries: 0  Review of Systems     Patient reports that her overall health is unchanged compared to last year.  History obtained from chart review  Patient Reported Readings (BP, Pulse, CBG, Weight, etc) none  Pain Assessment Pain : No/denies pain     Current Medications & Allergies (verified) Allergies as of 09/14/2018   No Known Allergies     Medication List       Accurate as of September 14, 2018 10:23 AM. If you have any questions, ask your nurse or doctor.        ascorbic acid 1000 MG tablet Commonly known as: VITAMIN C Take 1,000 mg by mouth daily.   atorvastatin 20 MG tablet Commonly known as: LIPITOR TAKE 1 TABLET DAILY EVERY EVENING AT 6 P.M.   levothyroxine 112 MCG tablet Commonly known as: SYNTHROID Take 1 tablet (112 mcg total) by mouth daily before breakfast.   sertraline 100 MG tablet Commonly known as: ZOLOFT Take 1 tablet (100 mg total) by mouth daily.   Vitamin D-3 25 MCG (1000 UT) Caps Take by mouth daily.       History (reviewed): Past Medical History:  Diagnosis Date  . Allergic rhinitis   . Anxiety   . Depression   . Hyperlipidemia   . Hypothyroidism 12/97  . Impacted cerumen of left ear   . Insomnia   . Osteoporosis   . Postmenopausal 1999  . Thyromegaly 2000   Past Surgical History:  Procedure Laterality Date  . EYE SURGERY Bilateral 2017   had cataract surgery in both eyes in the same year  . KNEE SURGERY    . NSVD  1963  . NSVD  1968  . NSVD  1971  . NSVD  1977  . TUBAL  LIGATION  1977   BILATERAL     Family History  Problem Relation Age of Onset  . Melanoma Brother   . Deep vein thrombosis Brother   . Hypertension Mother   . Stroke Mother   . Diabetes Paternal Grandfather   . Cancer Father        leukemia  . Healthy Daughter   . Healthy Son   . Heart disease Brother   . Healthy Daughter   . Hypothyroidism Daughter    Social History   Socioeconomic History  . Marital status: Married    Spouse name: [name]  . Number of  children: 4  . Years of education: 24  . Highest education level: Some college, no degree  Occupational History  . Occupation: retired  Scientific laboratory technician  . Financial resource strain: Not hard at all  . Food insecurity    Worry: Never true    Inability: Never true  . Transportation needs    Medical: No    Non-medical: No  Tobacco Use  . Smoking status: Former Smoker    Packs/day: 1.00    Years: 20.00    Pack years: 20.00    Types: Cigarettes    Quit date: 01/07/1992    Years since quitting: 26.7  . Smokeless tobacco: Never Used  Substance and Sexual Activity  . Alcohol use: No  . Drug use: No  . Sexual activity: Not Currently  Lifestyle  . Physical activity    Days per week: 0 days    Minutes per session: 0 min  . Stress: To some extent  Relationships  . Social connections    Talks on phone: More than three times a week    Gets together: Once a week    Attends religious service: Never    Active member of club or organization: No    Attends meetings of clubs or organizations: Never    Relationship status: Married  Other Topics Concern  . Not on file  Social History Narrative  . Not on file    Activities of Daily Living In your present state of health, do you have any difficulty performing the following activities: 09/14/2018  Hearing? N  Vision? Y  Comment had eye exam and needs to have "film removed from lens with laser"  Difficulty concentrating or making decisions? N  Walking or climbing stairs? N  Dressing or bathing? N  Doing errands, shopping? N  Preparing Food and eating ? N  Using the Toilet? N  In the past six months, have you accidently leaked urine? Y  Comment rarely-1-2 times in the last 6 months  Do you have problems with loss of bowel control? N  Managing your Medications? N  Managing your Finances? N  Housekeeping or managing your Housekeeping? N  Some recent data might be hidden    Patient Education/ Literacy How often do you need to have  someone help you when you read instructions, pamphlets, or other written materials from your doctor or pharmacy?: 1 - Never What is the last grade level you completed in school?: Graduated high school and then did 1 year of business college  Exercise Current Exercise Habits: The patient does not participate in regular exercise at present, Exercise limited by: None identified  Diet Patient reports consuming 3 meals a day and 1 snack(s) a day Patient reports that her primary diet is: Regular Patient reports that she does have regular access to food.   Depression Screen PHQ 2/9 Scores  09/14/2018 06/09/2018 06/08/2018 12/07/2017 09/10/2017 06/05/2017 12/12/2016  PHQ - 2 Score 3 3 3  0 2 2 3   PHQ- 9 Score 8 14 14  - 12 14 13      Fall Risk Fall Risk  09/14/2018 06/08/2018 12/07/2017 09/10/2017 06/05/2017  Falls in the past year? 0 0 0 No No  Number falls in past yr: 0 - - - -  Injury with Fall? 0 - - - -  Follow up Falls prevention discussed - - - -  Comment get rid of all throw rugs in the house, adequate lighting in the walkways and grab bars in the bathroom - - - -     Objective:  Sheila Ferrell seemed alert and oriented and she participated appropriately during our telephone visit.  Blood Pressure Weight BMI  BP Readings from Last 3 Encounters:  06/08/18 (!) 143/76  12/07/17 132/82  09/10/17 123/74   Wt Readings from Last 3 Encounters:  06/08/18 183 lb (83 kg)  12/07/17 181 lb (82.1 kg)  09/10/17 178 lb (80.7 kg)   BMI Readings from Last 1 Encounters:  06/08/18 34.58 kg/m    *Unable to obtain current vital signs, weight, and BMI due to telephone visit type  Hearing/Vision  . Sheila Ferrell did not seem to have difficulty with hearing/understanding during the telephone conversation . Reports that she has not had a formal eye exam by an eye care professional within the past year . Reports that she has not had a formal hearing evaluation within the past year *Unable to fully assess hearing and  vision during telephone visit type  Cognitive Function: 6CIT Screen 09/14/2018  What Year? 0 points  What month? 0 points  What time? 0 points  Count back from 20 0 points  Months in reverse 0 points  Repeat phrase 0 points  Total Score 0   (Normal:0-7, Significant for Dysfunction: >8)  Normal Cognitive Function Screening: Yes   Immunization & Health Maintenance Record Immunization History  Administered Date(s) Administered  . Influenza, High Dose Seasonal PF 10/15/2014, 10/16/2015, 10/11/2016, 10/30/2017  . Influenza,inj,Quad PF,6+ Mos 10/12/2013  . Pneumococcal Conjugate-13 01/10/2013  . Pneumococcal Polysaccharide-23 05/18/2014  . Tdap 04/11/2013    Health Maintenance  Topic Date Due  . INFLUENZA VACCINE  08/07/2018  . MAMMOGRAM  03/03/2019  . TETANUS/TDAP  04/12/2023  . DEXA SCAN  Completed  . PNA vac Low Risk Adult  Completed       Assessment  This is a routine wellness examination for Sheila Ferrell.  Health Maintenance: Due or Overdue Health Maintenance Due  Topic Date Due  . INFLUENZA VACCINE  08/07/2018    Sheila Ferrell does not need a referral for Community Assistance: Care Management:   no Social Work:    no Prescription Assistance:  no Nutrition/Diabetes Education:  no   Plan:  Personalized Goals Goals Addressed            This Visit's Progress   . DIET - EAT MORE FRUITS AND VEGETABLES        Personalized Health Maintenance & Screening Recommendations  Influenza vaccine Shingles vaccine  Lung Cancer Screening Recommended: no (Low Dose CT Chest recommended if Age 61-80 years, 30 pack-year currently smoking OR have quit w/in past 15 years) Hepatitis C Screening recommended: no HIV Screening recommended: no  Advanced Directives: Written information was not prepared per patient's request.  Referrals & Orders No orders of the defined types were placed in this encounter.   Follow-up Plan . Follow-up  with [name],  FNP as planned . Bring a copy of your Advanced Directives for our records . Consider Shingrix and Flu vaccines at your next visit with your PCP   I have personally reviewed and noted the following in the patient's chart:   . Medical and social history . Use of alcohol, tobacco or illicit drugs  . Current medications and supplements . Functional ability and status . Nutritional status . Physical activity . Advanced directives . List of other physicians . Hospitalizations, surgeries, and ER visits in previous 12 months . Vitals . Screenings to include cognitive, depression, and falls . Referrals and appointments  In addition, I have reviewed and discussed with Sheila Ferrell certain preventive protocols, quality metrics, and best practice recommendations. A written personalized care plan for preventive services as well as general preventive health recommendations is available and can be mailed to the patient at her request.      Marylin Crosby, LPN  [id number]   I have reviewed and agree with the above AWV documentation.   Mary-Margaret Hassell Done, FNP

## 2018-09-23 DIAGNOSIS — M75102 Unspecified rotator cuff tear or rupture of left shoulder, not specified as traumatic: Secondary | ICD-10-CM | POA: Insufficient documentation

## 2018-09-23 DIAGNOSIS — M25512 Pain in left shoulder: Secondary | ICD-10-CM | POA: Diagnosis not present

## 2018-09-30 DIAGNOSIS — H26493 Other secondary cataract, bilateral: Secondary | ICD-10-CM | POA: Diagnosis not present

## 2018-12-05 ENCOUNTER — Other Ambulatory Visit: Payer: Self-pay | Admitting: Nurse Practitioner

## 2018-12-05 DIAGNOSIS — E782 Mixed hyperlipidemia: Secondary | ICD-10-CM

## 2018-12-05 DIAGNOSIS — E034 Atrophy of thyroid (acquired): Secondary | ICD-10-CM

## 2018-12-07 ENCOUNTER — Other Ambulatory Visit: Payer: Self-pay

## 2018-12-08 ENCOUNTER — Encounter: Payer: Self-pay | Admitting: Nurse Practitioner

## 2018-12-08 ENCOUNTER — Ambulatory Visit (INDEPENDENT_AMBULATORY_CARE_PROVIDER_SITE_OTHER): Payer: Medicare Other | Admitting: Nurse Practitioner

## 2018-12-08 VITALS — BP 162/92 | HR 77 | Temp 97.3°F | Resp 20 | Ht 61.0 in | Wt 183.0 lb

## 2018-12-08 DIAGNOSIS — F411 Generalized anxiety disorder: Secondary | ICD-10-CM | POA: Diagnosis not present

## 2018-12-08 DIAGNOSIS — F3342 Major depressive disorder, recurrent, in full remission: Secondary | ICD-10-CM

## 2018-12-08 DIAGNOSIS — Z6833 Body mass index (BMI) 33.0-33.9, adult: Secondary | ICD-10-CM | POA: Diagnosis not present

## 2018-12-08 DIAGNOSIS — E559 Vitamin D deficiency, unspecified: Secondary | ICD-10-CM | POA: Diagnosis not present

## 2018-12-08 DIAGNOSIS — E034 Atrophy of thyroid (acquired): Secondary | ICD-10-CM

## 2018-12-08 DIAGNOSIS — E782 Mixed hyperlipidemia: Secondary | ICD-10-CM

## 2018-12-08 DIAGNOSIS — M810 Age-related osteoporosis without current pathological fracture: Secondary | ICD-10-CM

## 2018-12-08 DIAGNOSIS — I10 Essential (primary) hypertension: Secondary | ICD-10-CM | POA: Diagnosis not present

## 2018-12-08 MED ORDER — SERTRALINE HCL 100 MG PO TABS: 100.0000 mg | ORAL_TABLET | Freq: Every day | ORAL | 1 refills | Status: DC

## 2018-12-08 MED ORDER — LISINOPRIL 20 MG PO TABS: 20.0000 mg | ORAL_TABLET | Freq: Every day | ORAL | 3 refills | Status: DC

## 2018-12-08 NOTE — Progress Notes (Signed)
Subjective:    Patient ID: Sheila Ferrell, female    DOB: December 28, 1940, 78 y.o.   MRN: [id number]   Chief Complaint: Medical Management of Chronic Issues    HPI:  1. Mixed hyperlipidemia Not really watching diet and does no exercise. Lab Results  Component Value Date   CHOL 139 06/08/2018   HDL 50 06/08/2018   LDLCALC 57 06/08/2018   TRIG 162 (H) 06/08/2018   CHOLHDL 2.8 06/08/2018     2. Hypothyroidism due to acquired atrophy of thyroid No problems  3. Vitamin D deficiency Take a daily vitamin d supplement  4. Age-related osteoporosis without current pathological fracture Has aged out of dexascans.  5. GAD (generalized anxiety disorder) Is on zoloft with helps with her anxiety. GAD 7 : Generalized Anxiety Score 12/08/2018 06/09/2018 06/05/2017  Nervous, Anxious, on Edge 1 0 0  Control/stop worrying 0 2 2  Worry too much - different things 0 3 3  Trouble relaxing '1 2 2  ' Restless 0 1 1  Easily annoyed or irritable '1 1 1  ' Afraid - awful might happen 0 0 0  Total GAD 7 Score '3 9 9  ' Anxiety Difficulty Not difficult at all Somewhat difficult Somewhat difficult      6. Recurrent major depressive disorder, in full remission ([place]) Again she is on zoloft with no side effects. Depression screen [facility] 2/9 12/08/2018 09/14/2018 06/09/2018  Decreased Interest 0 1 1  Down, Depressed, Hopeless 0 2 2  PHQ - 2 Score 0 3 3  Altered sleeping - 0 2  Tired, decreased energy - 2 3  Change in appetite - 2 3  Feeling bad or failure about yourself  - 1 3  Trouble concentrating - 0 0  Moving slowly or fidgety/restless - 0 0  Suicidal thoughts - 0 0  PHQ-9 Score - 8 14  Difficult doing work/chores - Somewhat difficult Somewhat difficult  Some recent data might be hidden     7. BMI 33.0-33.9,adult No recent weight changes Wt Readings from Last 3 Encounters:  12/08/18 183 lb (83 kg)  06/08/18 183 lb (83 kg)  12/07/17 181 lb (82.1 kg)   BMI Readings from Last 3 Encounters:  12/08/18  34.58 kg/m  06/08/18 34.58 kg/m  12/07/17 35.35 kg/m       Outpatient Encounter Medications as of 12/08/2018  Medication Sig  . ascorbic acid (VITAMIN C) 1000 MG tablet Take 1,000 mg by mouth daily.  Marland Kitchen atorvastatin (LIPITOR) 20 MG tablet TAKE 1 TABLET DAILY EVERY EVENING AT 6 P.M.  . Cholecalciferol (VITAMIN D-3) 1000 UNITS CAPS Take by mouth daily.  Marland Kitchen LEVOXYL 112 MCG tablet TAKE 1 TABLET DAILY BEFORE BREAKFAST  . sertraline (ZOLOFT) 100 MG tablet Take 1 tablet (100 mg total) by mouth daily.     Past Surgical History:  Procedure Laterality Date  . EYE SURGERY Bilateral 2017   had cataract surgery in both eyes in the same year  . KNEE SURGERY    . NSVD  1963  . NSVD  1968  . NSVD  1971  . NSVD  1977  . TUBAL LIGATION  1977   BILATERAL      Family History  Problem Relation Age of Onset  . Melanoma Brother   . Deep vein thrombosis Brother   . Hypertension Mother   . Stroke Mother   . Diabetes Paternal Grandfather   . Cancer Father        leukemia  . Healthy Daughter   .  Healthy Son   . Heart disease Brother   . Healthy Daughter   . Hypothyroidism Daughter     New complaints: None today  Social history: Her husband is still living. She has to take complete care of her husband  Controlled substance contract: n/a    Review of Systems  Constitutional: Negative for activity change and appetite change.  HENT: Negative.   Eyes: Negative for pain.  Respiratory: Negative for shortness of breath.   Cardiovascular: Negative for chest pain, palpitations and leg swelling.  Gastrointestinal: Negative for abdominal pain.  Endocrine: Negative for polydipsia.  Genitourinary: Negative.   Skin: Negative for rash.  Neurological: Negative for dizziness, weakness and headaches.  Hematological: Does not bruise/bleed easily.  Psychiatric/Behavioral: Negative.   All other systems reviewed and are negative.      Objective:   Physical Exam Vitals signs and nursing note  reviewed.  Constitutional:      General: She is not in acute distress.    Appearance: Normal appearance. She is well-developed.  HENT:     Head: Normocephalic.     Nose: Nose normal.  Eyes:     Pupils: Pupils are equal, round, and reactive to light.  Neck:     Musculoskeletal: Normal range of motion and neck supple.     Vascular: No carotid bruit or JVD.  Cardiovascular:     Rate and Rhythm: Normal rate and regular rhythm.     Heart sounds: Normal heart sounds.  Pulmonary:     Effort: Pulmonary effort is normal. No respiratory distress.     Breath sounds: Normal breath sounds. No wheezing or rales.  Chest:     Chest wall: No tenderness.  Abdominal:     General: Bowel sounds are normal. There is no distension or abdominal bruit.     Palpations: Abdomen is soft. There is no hepatomegaly, splenomegaly, mass or pulsatile mass.     Tenderness: There is no abdominal tenderness.  Musculoskeletal: Normal range of motion.  Lymphadenopathy:     Cervical: No cervical adenopathy.  Skin:    General: Skin is warm and dry.  Neurological:     Mental Status: She is alert and oriented to person, place, and time.     Deep Tendon Reflexes: Reflexes are normal and symmetric.  Psychiatric:        Behavior: Behavior normal.        Thought Content: Thought content normal.        Judgment: Judgment normal.   BP (!) 162/92   Pulse 77   Temp (!) 97.3 F (36.3 C) (Temporal)   Resp 20   Ht '5\' 1"'  (1.549 m)   Wt 183 lb (83 kg)   SpO2 98%   BMI 34.58 kg/m       Assessment & Plan:  Sheila Ferrell comes in today with chief complaint of Medical Management of Chronic Issues   Diagnosis and orders addressed:  1. Mixed hyperlipidemia Low fat diet - Lipid panel  2. Hypothyroidism due to acquired atrophy of thyroid - Thyroid Panel With TSH  3. Vitamin D deficiency Continue daily vitamin d supplement  4. Age-related osteoporosis without current pathological fracture Weight bearing  exercise  5. GAD (generalized anxiety disorder) Stress management  6. Recurrent major depressive disorder, in full remission ([name]) - sertraline (ZOLOFT) 100 MG tablet; Take 1 tablet (100 mg total) by mouth daily.  Dispense: 90 tablet; Refill: 1  7. BMI 33.0-33.9,adult Discussed diet and exercise for person with BMI >25  Will recheck weight in 3-6 months  8. Essential hypertension Low sodium diet This is a new diagnosis for her Added lisinopril 66m daily Keep diary of blood pressure at home - lisinopril (ZESTRIL) 20 MG tablet; Take 1 tablet (20 mg total) by mouth daily.  Dispense: 90 tablet; Refill: 3 - [id number]+EGFR   Labs pending Health Maintenance reviewed Diet and exercise encouraged  Follow up plan: 6 months   [place] FNP

## 2018-12-08 NOTE — Patient Instructions (Signed)

## 2018-12-09 LAB — CMP14+EGFR
ALT: 17 IU/L (ref 0–32)
AST: 15 IU/L (ref 0–40)
Albumin/Globulin Ratio: 1.8 (ref 1.2–2.2)
Albumin: 4.6 g/dL (ref 3.7–4.7)
Alkaline Phosphatase: 134 IU/L — ABNORMAL HIGH (ref 39–117)
BUN/Creatinine Ratio: 15 (ref 12–28)
BUN: 12 mg/dL (ref 8–27)
Bilirubin Total: 0.4 mg/dL (ref 0.0–1.2)
CO2: 27 mmol/L (ref 20–29)
Calcium: 9.8 mg/dL (ref 8.7–10.3)
Chloride: 102 mmol/L (ref 96–106)
Creatinine, Ser: 0.81 mg/dL (ref 0.57–1.00)
GFR calc Af Amer: 80 mL/min/{1.73_m2} (ref >59)
GFR calc non Af Amer: 70 mL/min/{1.73_m2} (ref >59)
Globulin, Total: 2.6 g/dL (ref 1.5–4.5)
Glucose: 101 mg/dL — ABNORMAL HIGH (ref 65–99)
Potassium: 5 mmol/L (ref 3.5–5.2)
Sodium: 143 mmol/L (ref 134–144)
Total Protein: 7.2 g/dL (ref 6.0–8.5)

## 2018-12-09 LAB — THYROID PANEL WITH TSH
Free Thyroxine Index: 2.5 (ref 1.2–4.9)
T3 Uptake Ratio: 29 % (ref 24–39)
T4, Total: 8.5 ug/dL (ref 4.5–12.0)
TSH: 1.05 u[IU]/mL (ref 0.450–4.500)

## 2018-12-09 LAB — LIPID PANEL
Chol/HDL Ratio: 2.7 ratio (ref 0.0–4.4)
Cholesterol, Total: 129 mg/dL (ref 100–199)
HDL: 48 mg/dL (ref >39)
LDL Chol Calc (NIH): 59 mg/dL (ref 0–99)
Triglycerides: 121 mg/dL (ref 0–149)
VLDL Cholesterol Cal: 22 mg/dL (ref 5–40)

## 2019-03-17 ENCOUNTER — Ambulatory Visit (INDEPENDENT_AMBULATORY_CARE_PROVIDER_SITE_OTHER): Payer: Medicare Other | Admitting: *Deleted

## 2019-03-17 DIAGNOSIS — F3342 Major depressive disorder, recurrent, in full remission: Secondary | ICD-10-CM

## 2019-03-17 DIAGNOSIS — F411 Generalized anxiety disorder: Secondary | ICD-10-CM

## 2019-03-17 DIAGNOSIS — I10 Essential (primary) hypertension: Secondary | ICD-10-CM

## 2019-03-17 NOTE — Patient Instructions (Signed)
Visit Information  Goals Addressed            This Visit's Progress   . Chronic Disease Management Needs       CARE PLAN ENTRY (see longtitudinal plan of care for additional care plan information)  Current Barriers:  . Chronic Disease Management support, education, and care coordination needs related to Mixed hyperlipidemia, Hypothyroidism,GAD, Vitamin D deficiency, major depressive disorder, hypertension  Clinical Goal(s) related to Mixed hyperlipidemia, Hypothyroidism,GAD, Vitamin D deficiency, major depressive disorder, hypertension:  Over the next 60 days, patient will:  . Work with the care management team to address educational, disease management, and care coordination needs  . Begin or continue self health monitoring activities as directed today Measure and record blood pressure 5 times per week . Call provider office for new or worsened signs and symptoms Blood pressure findings outside established parameters and New or worsened symptom related to chronic medical conditions . Call care management team with questions or concerns . Verbalize basic understanding of patient centered plan of care established today  Interventions related to Mixed hyperlipidemia, Hypothyroidism,GAD, Vitamin D deficiency, major depressive disorder, hypertension:  . Evaluation of current treatment plans and patient's adherence to plan as established by provider . Assessed patient understanding of disease states . Assessed patient's education and care coordination needs . Provided disease specific education to patient  . Collaborated with appropriate clinical care team members regarding patient needs  Patient Self Care Activities related to Mixed hyperlipidemia, Hypothyroidism,GAD, Vitamin D deficiency, major depressive disorder, hypertension:  . Patient is unable to independently self-manage chronic health conditions  Initial goal documentation     . Increase Physical Activity       CARE PLAN  ENTRY (see longtitudinal plan of care for additional care plan information)  Current Barriers:  . 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 Chart reviewed . Talked with patient by telephone o She would like to increase her physical activity level but is currently unable to do very much because most of her attention goes to taking care of her disabled husband o She does have [place] services that are supposed to start Monday and should be able to free up some of her time . Discussed increased physical activity and exercise . Discussed weight increase and management . Discussed hypothyroidism. Reviewed medications and recent lab results.  . Encouraged patient to keep f/u with PCP . Provided with CCM contact info and encouraged to reach out as needed  Patient Self Care Activities:  . Performs ADL's independently . Performs IADL's independently  Initial goal documentation     . Stress Reduction       CARE PLAN ENTRY (see longtitudinal plan of care for additional care plan information)  Current Barriers:  . Chronic Disease Management support and education needs related to anxiety, depression, and stress management . 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 Chart reviewed including office notes and lab results . Medications reviewed and discussed with patient: Zoloft . Encouraged patient to talk with LCSW regarding stress and caregiver strain . Talked with patient about stress and caregiver strain o [place] is going to start  providing in-home services to her husband next week . Collaborated with LCSW. Patient will be  scheduled for outreach.  . Provided with CCM contact information and encouraged to reach out as needed  Patient Self Care Activities:  . Performs ADL's independently . Performs IADL's independently   Initial goal documentation        The patient verbalized understanding of instructions provided today and declined a print copy of patient instruction materials.   Follow-up Plan The care management team will reach out to the patient again over the next 45 days.   Chong Sicilian, BSN, RN-BC Embedded Chronic Care Manager [facility] [place] [facility] Direct Dial: [phone]

## 2019-03-17 NOTE — Chronic Care Management (AMB) (Signed)
Chronic Care Management   Initial Visit Note  03/17/2019 Name: Sheila Ferrell MRN: [id number] DOB: 08/07/1940  Referred by: [name], FNP Reason for referral : Chronic Care Management (RN initial visit)   Sheila Ferrell is a 79 y.o. year old female who is a primary care patient of [name], [place]. The CCM team was consulted for assistance with chronic disease management and care coordination needs related to Mixed hyperlipidemia, Hypothyroidism,GAD, Vitamin D deficiency, major depressive disorder, hypertension.  Review of patient status, including review of consultants reports, relevant laboratory and other test results, and collaboration with appropriate care team members and the patient's provider was performed as part of comprehensive patient evaluation and provision of chronic care management services.    Subjective: I spoke with [name] by telephone today regarding her chronic medical conditions. Her primary concern at this time is weight management and stress reduction.   SDOH (Social Determinants of Health) assessments performed: Yes See Care Plan activities for detailed interventions related to SDOH)  SDOH Interventions     Most Recent Value  SDOH Interventions  SDOH Interventions for the Following Domains  Stress, Physical Activity  Physical Activity Interventions  Other (Comments) [verbal education provided]  Stress Interventions  Other (Comment) [encouraged patient to talk with LCSW]       Objective: Outpatient Encounter Medications as of 03/17/2019  Medication Sig  . ascorbic acid (VITAMIN C) 1000 MG tablet Take 1,000 mg by mouth daily.  Marland Kitchen atorvastatin (LIPITOR) 20 MG tablet TAKE 1 TABLET DAILY EVERY EVENING AT 6 P.M.  . Cholecalciferol (VITAMIN D-3) 1000 UNITS CAPS Take by mouth daily.  Marland Kitchen LEVOXYL 112 MCG tablet TAKE 1 TABLET DAILY BEFORE BREAKFAST  . lisinopril (ZESTRIL) 20 MG tablet Take 1 tablet (20 mg total) by mouth daily.  . sertraline  (ZOLOFT) 100 MG tablet Take 1 tablet (100 mg total) by mouth daily.   No facility-administered encounter medications on file as of 03/17/2019.     BP Readings from Last 3 Encounters:  12/08/18 (!) 162/92  06/08/18 (!) 143/76  12/07/17 132/82   BMI Readings from Last 3 Encounters:  12/08/18 34.58 kg/m  06/08/18 34.58 kg/m  12/07/17 35.35 kg/m     RN Assessment & Care Plan   . Chronic Disease Management Needs       CARE PLAN ENTRY (see longtitudinal plan of care for additional care plan information)  Current Barriers:  . Chronic Disease Management support, education, and care coordination needs related to Mixed hyperlipidemia, Hypothyroidism,GAD, Vitamin D deficiency, major depressive disorder, hypertension  Clinical Goal(s) related to Mixed hyperlipidemia, Hypothyroidism,GAD, Vitamin D deficiency, major depressive disorder, hypertension:  Over the next 60 days, patient will:  . Work with the care management team to address educational, disease management, and care coordination needs  . Begin or continue self health monitoring activities as directed today Measure and record blood pressure 5 times per week . Call provider office for new or worsened signs and symptoms Blood pressure findings outside established parameters and New or worsened symptom related to chronic medical conditions . Call care management team with questions or concerns . Verbalize basic understanding of patient centered plan of care established today  Interventions related to Mixed hyperlipidemia, Hypothyroidism,GAD, Vitamin D deficiency, major depressive disorder, hypertension:  . Evaluation of current treatment plans and patient's adherence to plan as established by provider . Assessed patient understanding of disease states . Assessed patient's education and care coordination needs . Provided disease specific education to patient  .  Collaborated with appropriate clinical care team members regarding patient  needs  Patient Self Care Activities related to Mixed hyperlipidemia, Hypothyroidism,GAD, Vitamin D deficiency, major depressive disorder, hypertension:  . Patient is unable to independently self-manage chronic health conditions  Initial goal documentation     . Increase Physical Activity       CARE PLAN ENTRY (see longtitudinal plan of care for additional care plan information)  Current Barriers:  . 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 Chart reviewed . Talked with patient by telephone o She would like to increase her physical activity level but is currently unable to do very much because most of her attention goes to taking care of her disabled husband o She does have [place] services that are supposed to start Monday and should be able to free up some of her time . Discussed increased physical activity and exercise . Discussed weight increase and management . Discussed hypothyroidism. Reviewed medications and recent lab results.  . Encouraged patient to keep f/u with PCP . Provided with CCM contact info and encouraged to reach out as needed  Patient Self Care Activities:  . Performs ADL's independently . Performs IADL's independently  Initial goal documentation     . Stress Reduction       CARE PLAN ENTRY (see longtitudinal plan of care for additional care plan information)  Current Barriers:  . Chronic Disease Management support and education needs related to anxiety, depression, and stress management . 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 Chart reviewed including office notes and lab  results . Medications reviewed and discussed with patient: Zoloft . Encouraged patient to talk with LCSW regarding stress and caregiver strain . Talked with patient about stress and caregiver strain o [place] is going to start providing in-home services to her husband next week . Collaborated with LCSW. Patient will be scheduled for outreach.  . Provided with CCM contact information and encouraged to reach out as needed  Patient Self Care Activities:  . Performs ADL's independently . Performs IADL's independently   Initial goal documentation         Fllow-up Plan:   The care management team will reach out to the patient again over the next 45 days.   Chong Sicilian, BSN, RN-BC Embedded Chronic Care Manager [facility] [place] [facility] Direct Dial: [phone]

## 2019-06-09 ENCOUNTER — Other Ambulatory Visit: Payer: Self-pay

## 2019-06-09 ENCOUNTER — Encounter: Payer: Self-pay | Admitting: Nurse Practitioner

## 2019-06-09 ENCOUNTER — Ambulatory Visit (INDEPENDENT_AMBULATORY_CARE_PROVIDER_SITE_OTHER): Payer: Medicare PPO | Admitting: Nurse Practitioner

## 2019-06-09 VITALS — BP 121/64 | HR 89 | Temp 97.9°F | Resp 20 | Ht 61.0 in | Wt 179.0 lb

## 2019-06-09 DIAGNOSIS — E782 Mixed hyperlipidemia: Secondary | ICD-10-CM | POA: Diagnosis not present

## 2019-06-09 DIAGNOSIS — I10 Essential (primary) hypertension: Secondary | ICD-10-CM

## 2019-06-09 DIAGNOSIS — F3342 Major depressive disorder, recurrent, in full remission: Secondary | ICD-10-CM | POA: Diagnosis not present

## 2019-06-09 DIAGNOSIS — F411 Generalized anxiety disorder: Secondary | ICD-10-CM | POA: Diagnosis not present

## 2019-06-09 DIAGNOSIS — E034 Atrophy of thyroid (acquired): Secondary | ICD-10-CM

## 2019-06-09 DIAGNOSIS — E559 Vitamin D deficiency, unspecified: Secondary | ICD-10-CM

## 2019-06-09 DIAGNOSIS — M810 Age-related osteoporosis without current pathological fracture: Secondary | ICD-10-CM

## 2019-06-09 DIAGNOSIS — Z6833 Body mass index (BMI) 33.0-33.9, adult: Secondary | ICD-10-CM

## 2019-06-09 MED ORDER — LISINOPRIL 20 MG PO TABS: 20.0000 mg | ORAL_TABLET | Freq: Every day | ORAL | 1 refills | Status: DC

## 2019-06-09 MED ORDER — SERTRALINE HCL 100 MG PO TABS: 100.0000 mg | ORAL_TABLET | Freq: Every day | ORAL | 1 refills | Status: DC

## 2019-06-09 MED ORDER — LEVOTHYROXINE SODIUM 112 MCG PO TABS: 112.0000 ug | ORAL_TABLET | Freq: Every day | ORAL | 1 refills | Status: DC

## 2019-06-09 MED ORDER — ATORVASTATIN CALCIUM 20 MG PO TABS: 20.0000 mg | ORAL_TABLET | Freq: Every day | ORAL | 1 refills | Status: DC

## 2019-06-09 NOTE — Patient Instructions (Signed)
Stress, Adult Stress is a normal reaction to life events. Stress is what you feel when life demands more than you are used to, or more than you think you can handle. Some stress can be useful, such as studying for a test or meeting a deadline at work. Stress that occurs too often or for too long can cause problems. It can affect your emotional health and interfere with relationships and normal daily activities. Too much stress can weaken your body's defense system (immune system) and increase your risk for physical illness. If you already have a medical problem, stress can make it worse. What are the causes? All sorts of life events can cause stress. An event that causes stress for one person may not be stressful for another person. Major life events, whether positive or negative, commonly cause stress. Examples include:  Losing a job or starting a new job.  Losing a loved one.  Moving to a new town or home.  Getting married or divorced.  Having a baby.  Getting injured or sick. Less obvious life events can also cause stress, especially if they occur day after day or in combination with each other. Examples include:  Working long hours.  Driving in traffic.  Caring for children.  Being in debt.  Being in a difficult relationship. What are the signs or symptoms? Stress can cause emotional symptoms, including:  Anxiety. This is feeling worried, afraid, on edge, overwhelmed, or out of control.  Anger, including irritation or impatience.  Depression. This is feeling sad, down, helpless, or guilty.  Trouble focusing, remembering, or making decisions. Stress can cause physical symptoms, including:  Aches and pains. These may affect your head, neck, back, stomach, or other areas of your body.  Tight muscles or a clenched jaw.  Low energy.  Trouble sleeping. Stress can cause unhealthy behaviors, including:  Eating to feel better (overeating) or skipping meals.  Working too  much or putting off tasks.  Smoking, drinking alcohol, or using drugs to feel better. How is this diagnosed? Stress is diagnosed through an assessment by your health care provider. He or she may diagnose this condition based on:  Your symptoms and any stressful life events.  Your medical history.  Tests to rule out other causes of your symptoms. Depending on your condition, your health care provider may refer you to a specialist for further evaluation. How is this treated?  Stress management techniques are the recommended treatment for stress. Medicine is not typically recommended for the treatment of stress. Techniques to reduce your reaction to stressful life events include:  Stress identification. Monitor yourself for symptoms of stress and identify what causes stress for you. These skills may help you to avoid or prepare for stressful events.  Time management. Set your priorities, keep a calendar of events, and learn to say no. Taking these actions can help you avoid making too many commitments. Techniques for coping with stress include:  Rethinking the problem. Try to think realistically about stressful events rather than ignoring them or overreacting. Try to find the positives in a stressful situation rather than focusing on the negatives.  Exercise. Physical exercise can release both physical and emotional tension. The key is to find a form of exercise that you enjoy and do it regularly.  Relaxation techniques. These relax the body and mind. The key is to find one or more that you enjoy and use the techniques regularly. Examples include: ? Meditation, deep breathing, or progressive relaxation techniques. ? Yoga or   tai chi. ? Biofeedback, mindfulness techniques, or journaling. ? Listening to music, being out in nature, or participating in other hobbies.  Practicing a healthy lifestyle. Eat a balanced diet, drink plenty of water, limit or avoid caffeine, and get plenty of  sleep.  Having a strong support network. Spend time with family, friends, or other people you enjoy being around. Express your feelings and talk things over with someone you trust. Counseling or talk therapy with a mental health professional may be helpful if you are having trouble managing stress on your own. Follow these instructions at home: Lifestyle   Avoid drugs.  Do not use any products that contain nicotine or tobacco, such as cigarettes, e-cigarettes, and chewing tobacco. If you need help quitting, ask your health care provider.  Limit alcohol intake to no more than 1 drink a day for nonpregnant women and 2 drinks a day for men. One drink equals 12 oz of beer, 5 oz of wine, or 1 oz of hard liquor  Do not use alcohol or drugs to relax.  Eat a balanced diet that includes fresh fruits and vegetables, whole grains, lean meats, fish, eggs, and beans, and low-fat dairy. Avoid processed foods and foods high in added fat, sugar, and salt.  Exercise at least 30 minutes on 5 or more days each week.  Get 7-8 hours of sleep each night. General instructions   Practice stress management techniques as discussed with your health care provider.  Drink enough fluid to keep your urine clear or pale yellow.  Take over-the-counter and prescription medicines only as told by your health care provider.  Keep all follow-up visits as told by your health care provider. This is important. Contact a health care provider if:  Your symptoms get worse.  You have new symptoms.  You feel overwhelmed by your problems and can no longer manage them on your own. Get help right away if:  You have thoughts of hurting yourself or others. If you ever feel like you may hurt yourself or others, or have thoughts about taking your own life, get help right away. You can go to your nearest emergency department or call:  Your local emergency services (911 in the [place]  A suicide crisis helpline, such as the  [place] at (316) 250-6172. This is open 24 hours a day. Summary  Stress is a normal reaction to life events. It can cause problems if it happens too often or for too long.  Practicing stress management techniques is the best way to treat stress.  Counseling or talk therapy with a mental health professional may be helpful if you are having trouble managing stress on your own. This information is not intended to replace advice given to you by your health care provider. Make sure you discuss any questions you have with your health care provider. Document Revised: 07/23/2018 Document Reviewed: 02/13/2016 Elsevier Patient Education  [place].

## 2019-06-09 NOTE — Progress Notes (Signed)
Subjective:    Patient ID: Sheila Ferrell, female    DOB: 05-Feb-1940, 79 y.o.   MRN: [id number]   Chief Complaint: Medical Management of Chronic Issues    HPI:  1. Mixed hyperlipidemia Does not watch diet and does no dedicated exercise. Lab Results  Component Value Date   CHOL 129 12/08/2018   HDL 48 12/08/2018   LDLCALC 59 12/08/2018   TRIG 121 12/08/2018   CHOLHDL 2.7 12/08/2018    2. Hypothyroidism due to acquired atrophy of thyroid No problems that aware of  3. Recurrent major depressive disorder, in full remission ([place]) Still on zoloft daily. Depression screen [facility] 2/9 06/09/2019 12/08/2018 09/14/2018  Decreased Interest 0 0 1  Down, Depressed, Hopeless 0 0 2  PHQ - 2 Score 0 0 3  Altered sleeping - - 0  Tired, decreased energy - - 2  Change in appetite - - 2  Feeling bad or failure about yourself  - - 1  Trouble concentrating - - 0  Moving slowly or fidgety/restless - - 0  Suicidal thoughts - - 0  PHQ-9 Score - - 8  Difficult doing work/chores - - Somewhat difficult  Some recent data might be hidden     4. GAD (generalized anxiety disorder) Doing well  GAD 7 : Generalized Anxiety Score 06/09/2019 12/08/2018 06/09/2018 06/05/2017  Nervous, Anxious, on Edge 0 1 0 0  Control/stop worrying 1 0 2 2  Worry too much - different things 1 0 3 3  Trouble relaxing _0 Restless 3 0 1 1  Easily annoyed or irritable 0 _1 Afraid - awful might happen 1 0 0 0  Total GAD 7 Score _2 Anxiety Difficulty Somewhat difficult Not difficult at all Somewhat difficult Somewhat difficult     5. Vitamin D deficiency Takes daily vitamin d supplement  6. Age-related osteoporosis without current pathological fracture Lat dexacan was done on 09/09/16. t score was -2.2. We stopped fosamax.  7. BMI 33.0-33.9,adult No recent weight changes Wt Readings from Last 3 Encounters:  06/09/19 179 lb (81.2 kg)  12/08/18 183 lb (83 kg)  06/08/18 183 lb (83 kg)   BMI Readings from  Last 3 Encounters:  06/09/19 33.82 kg/m  12/08/18 34.58 kg/m  06/08/18 34.58 kg/m   8. Hypertension No c/o chest pain, sob or headache. Occasionally checks blood pressure at home. BP Readings from Last 3 Encounters:  06/09/19 121/64  12/08/18 (!) 162/92  06/08/18 (!) 143/76       Outpatient Encounter Medications as of 06/09/2019  Medication Sig  . ascorbic acid (VITAMIN C) 1000 MG tablet Take 1,000 mg by mouth daily.  Marland Kitchen atorvastatin (LIPITOR) 20 MG tablet TAKE 1 TABLET DAILY EVERY EVENING AT 6 P.M.  . Cholecalciferol (VITAMIN D-3) 1000 UNITS CAPS Take by mouth daily.  Marland Kitchen LEVOXYL 112 MCG tablet TAKE 1 TABLET DAILY BEFORE BREAKFAST  . lisinopril (ZESTRIL) 20 MG tablet Take 1 tablet (20 mg total) by mouth daily.  . sertraline (ZOLOFT) 100 MG tablet Take 1 tablet (100 mg total) by mouth daily.   No facility-administered encounter medications on file as of 06/09/2019.    Past Surgical History:  Procedure Laterality Date  . EYE SURGERY Bilateral 2017   had cataract surgery in both eyes in the same year  . KNEE SURGERY    . NSVD  1963  . NSVD  1968  . NSVD  1971  . NSVD  1977  .  TUBAL LIGATION  1977   BILATERAL      Family History  Problem Relation Age of Onset  . Melanoma Brother   . Deep vein thrombosis Brother   . Hypertension Mother   . Stroke Mother   . Diabetes Paternal Grandfather   . Cancer Father        leukemia  . Healthy Daughter   . Healthy Son   . Heart disease Brother   . Healthy Daughter   . Hypothyroidism Daughter     New complaints: None today  Social history: Live with husband- has parkinson and dementia  Controlled substance contract: n/a    Review of Systems  Constitutional: Negative for diaphoresis.  Eyes: Negative for pain.  Respiratory: Negative for shortness of breath.   Cardiovascular: Negative for chest pain, palpitations and leg swelling.  Gastrointestinal: Negative for abdominal pain.  Endocrine: Negative for polydipsia.   Skin: Negative for rash.  Neurological: Negative for dizziness, weakness and headaches.  Hematological: Does not bruise/bleed easily.  All other systems reviewed and are negative.      Objective:   Physical Exam Vitals and nursing note reviewed.  Constitutional:      General: She is not in acute distress.    Appearance: Normal appearance. She is well-developed.  HENT:     Head: Normocephalic.     Nose: Nose normal.  Eyes:     Pupils: Pupils are equal, round, and reactive to light.  Neck:     Vascular: No carotid bruit or JVD.  Cardiovascular:     Rate and Rhythm: Normal rate and regular rhythm.     Heart sounds: Normal heart sounds.  Pulmonary:     Effort: Pulmonary effort is normal. No respiratory distress.     Breath sounds: Normal breath sounds. No wheezing or rales.  Chest:     Chest wall: No tenderness.  Abdominal:     General: Bowel sounds are normal. There is no distension or abdominal bruit.     Palpations: Abdomen is soft. There is no hepatomegaly, splenomegaly, mass or pulsatile mass.     Tenderness: There is no abdominal tenderness.  Musculoskeletal:        General: Normal range of motion.     Cervical back: Normal range of motion and neck supple.  Lymphadenopathy:     Cervical: No cervical adenopathy.  Skin:    General: Skin is warm and dry.  Neurological:     Mental Status: She is alert and oriented to person, place, and time.     Deep Tendon Reflexes: Reflexes are normal and symmetric.  Psychiatric:        Behavior: Behavior normal.        Thought Content: Thought content normal.        Judgment: Judgment normal.    BP 121/64   Pulse 89   Temp 97.9 F (36.6 C) (Temporal)   Resp 20   Ht _0  (1.549 m)   Wt 179 lb (81.2 kg)   SpO2 96%   BMI 33.82 kg/m         Assessment & Plan:  Sheila Ferrell comes in today with chief complaint of Medical Management of Chronic Issues   Diagnosis and orders addressed:  1. Mixed hyperlipidemia Low  fat diet - atorvastatin (LIPITOR) 20 MG tablet; Take 1 tablet (20 mg total) by mouth daily.  Dispense: 90 tablet; Refill: 1 - Lipid panel  2. Hypothyroidism due to acquired atrophy of thyroid Labs pending - levothyroxine (  LEVOXYL) 112 MCG tablet; Take 1 tablet (112 mcg total) by mouth daily before breakfast.  Dispense: 90 tablet; Refill: 1 - Thyroid Panel With TSH  3. Recurrent major depressive disorder, in full remission ([place]) Stress management - sertraline (ZOLOFT) 100 MG tablet; Take 1 tablet (100 mg total) by mouth daily.  Dispense: 90 tablet; Refill: 1  4. GAD (generalized anxiety disorder) stress management  5. Vitamin D deficiency Continue daily vitamin d   6. Age-related osteoporosis without current pathological fracture Weight bearing exercise encouraged  7. BMI 33.0-33.9,adult Discussed diet and exercise for person with BMI >25 Will recheck weight in 3-6 months  8. Essential hypertension Low sodium diet - lisinopril (ZESTRIL) 20 MG tablet; Take 1 tablet (20 mg total) by mouth daily.  Dispense: 90 tablet; Refill: 1 - CMP14+EGFR   Labs pending Health Maintenance reviewed Diet and exercise encouraged  Follow up plan: 6 months   Mary-Margaret Hassell Done, FNP

## 2019-06-10 LAB — LIPID PANEL
Chol/HDL Ratio: 2.7 ratio (ref 0.0–4.4)
Cholesterol, Total: 117 mg/dL (ref 100–199)
HDL: 43 mg/dL (ref >39)
LDL Chol Calc (NIH): 55 mg/dL (ref 0–99)
Triglycerides: 103 mg/dL (ref 0–149)
VLDL Cholesterol Cal: 19 mg/dL (ref 5–40)

## 2019-06-10 LAB — CMP14+EGFR
ALT: 15 IU/L (ref 0–32)
AST: 17 IU/L (ref 0–40)
Albumin/Globulin Ratio: 2 (ref 1.2–2.2)
Albumin: 4.6 g/dL (ref 3.7–4.7)
Alkaline Phosphatase: 116 IU/L (ref 48–121)
BUN/Creatinine Ratio: 16 (ref 12–28)
BUN: 15 mg/dL (ref 8–27)
Bilirubin Total: 0.4 mg/dL (ref 0.0–1.2)
CO2: 27 mmol/L (ref 20–29)
Calcium: 9.7 mg/dL (ref 8.7–10.3)
Chloride: 102 mmol/L (ref 96–106)
Creatinine, Ser: 0.95 mg/dL (ref 0.57–1.00)
GFR calc Af Amer: 66 mL/min/{1.73_m2} (ref >59)
GFR calc non Af Amer: 57 mL/min/{1.73_m2} — ABNORMAL LOW (ref >59)
Globulin, Total: 2.3 g/dL (ref 1.5–4.5)
Glucose: 102 mg/dL — ABNORMAL HIGH (ref 65–99)
Potassium: 5.4 mmol/L — ABNORMAL HIGH (ref 3.5–5.2)
Sodium: 143 mmol/L (ref 134–144)
Total Protein: 6.9 g/dL (ref 6.0–8.5)

## 2019-06-10 LAB — THYROID PANEL WITH TSH
Free Thyroxine Index: 2.4 (ref 1.2–4.9)
T3 Uptake Ratio: 31 % (ref 24–39)
T4, Total: 7.9 ug/dL (ref 4.5–12.0)
TSH: 0.831 u[IU]/mL (ref 0.450–4.500)

## 2019-08-08 ENCOUNTER — Telehealth: Payer: Self-pay | Admitting: Nurse Practitioner

## 2019-08-08 MED ORDER — ALPRAZOLAM 0.25 MG PO TABS
0.2500 mg | ORAL_TABLET | Freq: Two times a day (BID) | ORAL | 0 refills | Status: DC | PRN
Start: 2019-08-08 — End: 2020-10-18

## 2019-08-08 NOTE — Telephone Encounter (Signed)
Husband passed away and visitation is tonight and patient needs something to calm her down Meds ordered this encounter  Medications  . ALPRAZolam (XANAX) 0.25 MG tablet    Sig: Take 1 tablet (0.25 mg total) by mouth 2 (two) times daily as needed for anxiety.    Dispense:  10 tablet    Refill:  0    Order Specific Question:   Supervising Provider    Answer:   Caryl Pina A [id number]

## 2019-10-18 ENCOUNTER — Ambulatory Visit (INDEPENDENT_AMBULATORY_CARE_PROVIDER_SITE_OTHER): Payer: Medicare PPO

## 2019-10-18 DIAGNOSIS — Z0000 Encounter for general adult medical examination without abnormal findings: Secondary | ICD-10-CM | POA: Diagnosis not present

## 2019-10-18 NOTE — Progress Notes (Signed)
MEDICARE ANNUAL WELLNESS VISIT  10/18/2019  Telephone Visit Disclaimer This Medicare AWV was conducted by telephone due to national recommendations for restrictions regarding the COVID-19 Pandemic (e.g. social distancing).  I verified, using two identifiers, that I am speaking with Sheila Ferrell or their authorized healthcare agent. I discussed the limitations, risks, security, and privacy concerns of performing an evaluation and management service by telephone and the potential availability of an in-person appointment in the future. The patient expressed understanding and agreed to proceed.  Location of Patient: Home  Location of Provider (nurse):  [facility]  Subjective:    Sheila Ferrell is a 79 y.o. female patient of Sheila Ferrell, [place] who had a Medicare Annual Wellness Visit today via telephone. Sheila Ferrell is a very pleasant lady who resides in [place]. Her husband passed away a few months ago and she currently lives alone. She was his caregiver for quite some time and states that she has a "broken heart." She has four children and one of them lives next door to her. 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 She enjoys spending time with her great grandchild and working crossword puzzles. She feels her health is about the same as it was this time last year. She stays fairly active and doesn't have a regular exercise routine. She is currently due for a mammogram and also a Hep C screening which she will do at her next follow up visit.     Patient Care Team: Sheila Ferrell, Pablo as PCP - General (Nurse Practitioner) Melina Schools, OD (Optometry) Shea Evans, Norva Riffle, LCSW as Social Worker (Licensed Clinical Social Worker) Ilean China, RN as Registered Nurse  Advanced Directives 10/18/2019 09/14/2018 09/10/2017 06/13/2016 02/15/2014  Does Patient Have a Medical Advance Directive? Yes Yes Yes Yes Yes  Type of Advance Directive  Living will [place];Living will [place];Living will [place];Living will Living will  Does patient want to make changes to medical advance directive? No - Patient declined No - Patient declined No - Patient declined Yes (MAU/Ambulatory/Procedural Areas - Information given) -  Copy of [place] in Chart? - No - copy requested No - copy requested No - copy requested [facility] Utilization Over the Past 12 Months: # of hospitalizations or ER visits: 0 # of surgeries: 0  Review of Systems    Patient reports that her overall health is unchanged compared to last year.  History obtained from chart review  Patient Reported Readings (BP, Pulse, CBG, Weight, etc) none  Pain Assessment Pain : No/denies pain     Current Medications & Allergies (verified) Allergies as of 10/18/2019   No Known Allergies     Medication List       Accurate as of October 18, 2019  2:52 PM. If you have any questions, ask your nurse or doctor.        ALPRAZolam 0.25 MG tablet Commonly known as: XANAX Take 1 tablet (0.25 mg total) by mouth 2 (two) times daily as needed for anxiety.   ascorbic acid 1000 MG tablet Commonly known as: VITAMIN C Take 1,000 mg by mouth daily.   atorvastatin 20 MG tablet Commonly known as: LIPITOR Take 1 tablet (20 mg total) by mouth daily.   levothyroxine 112 MCG tablet Commonly known as: Levoxyl Take 1 tablet (112 mcg total) by mouth daily before breakfast.   lisinopril 20 MG  tablet Commonly known as: ZESTRIL Take 1 tablet (20 mg total) by mouth daily.   sertraline 100 MG tablet Commonly known as: ZOLOFT Take 1 tablet (100 mg total) by mouth daily.   Vitamin D-3 25 MCG (1000 UT) Caps Take by mouth daily.       History (reviewed): Past Medical History:  Diagnosis Date  . Allergic rhinitis   . Anxiety   . Depression   . Hyperlipidemia   . Hypothyroidism 12/97  . Impacted  cerumen of left ear   . Insomnia   . Osteoporosis   . Postmenopausal 1999  . Thyromegaly 2000   Past Surgical History:  Procedure Laterality Date  . EYE SURGERY Bilateral 2017   had cataract surgery in both eyes in the same year  . KNEE SURGERY    . NSVD  1963  . NSVD  1968  . NSVD  1971  . NSVD  1977  . TUBAL LIGATION  1977   BILATERAL     Family History  Problem Relation Age of Onset  . Melanoma Brother   . Deep vein thrombosis Brother   . Hypertension Mother   . Stroke Mother   . Diabetes Paternal Grandfather   . Cancer Father        leukemia  . Healthy Daughter   . Healthy Son   . Heart disease Brother   . Healthy Daughter   . Hypothyroidism Daughter    Social History   Socioeconomic History  . Marital status: Widowed    Spouse name: Sheila Ferrell (Gene)  . Number of children: 4  . Years of education: 51  . Highest education level: Some college, no degree  Occupational History  . Occupation: retired  Tobacco Use  . Smoking status: Former Smoker    Packs/day: 1.00    Years: 20.00    Pack years: 20.00    Types: Cigarettes    Quit date: 01/07/1992    Years since quitting: 27.7  . Smokeless tobacco: Never Used  Vaping Use  . Vaping Use: Never used  Substance and Sexual Activity  . Alcohol use: No  . Drug use: No  . Sexual activity: Not Currently  Other Topics Concern  . Not on file  Social History Narrative  . Not on file   Social Determinants of Health   Financial Resource Strain:   . Difficulty of Paying Living Expenses: Not on file  Food Insecurity: No Food Insecurity  . Worried About Charity fundraiser in the Last Year: Never true  . Ran Out of Food in the Last Year: Never true  Transportation Needs:   . Lack of Transportation (Medical): Not on file  . Lack of Transportation (Non-Medical): Not on file  Physical Activity: Inactive  . Days of Exercise per Week: 0 days  . Minutes of Exercise per Session: 0 min  Stress: Stress Concern Present    . Feeling of Stress : Rather much  Social Connections:   . Frequency of Communication with Friends and Family: Not on file  . Frequency of Social Gatherings with Friends and Family: Not on file  . Attends Religious Services: Not on file  . Active Member of Clubs or Organizations: Not on file  . Attends Archivist Meetings: Not on file  . Marital Status: Not on file    Activities of Daily Living In your present state of health, do you have any difficulty performing the following activities: 10/18/2019 03/17/2019  Hearing? N N  Vision?  N N  Difficulty concentrating or making decisions? N N  Walking or climbing stairs? N N  Dressing or bathing? N N  Doing errands, shopping? N N  Preparing Food and eating ? N N  Using the Toilet? N N  In the past six months, have you accidently leaked urine? N N  Do you have problems with loss of bowel control? N N  Managing your Medications? N N  Managing your Finances? N N  Housekeeping or managing your Housekeeping? N N  Some recent data might be hidden    Patient Education/ Literacy How often do you need to have someone help you when you read instructions, pamphlets, or other written materials from your doctor or pharmacy?: 1 - Never What is the last grade level you completed in school?: 1 year business college after graduating  Exercise Current Exercise Habits: The patient does not participate in regular exercise at present, Exercise limited by: None identified  Diet Patient reports consuming 3 meals a day and 2 snack(s) a day Patient reports that her primary diet is: Regular Patient reports that she does have regular access to food.   Depression Screen PHQ 2/9 Scores 06/09/2019 12/08/2018 09/14/2018 06/09/2018 06/08/2018 12/07/2017 09/10/2017  PHQ - 2 Score 0 0 3 3 3  0 2  PHQ- 9 Score - - 8 14 14  - 12     Fall Risk Fall Risk  10/18/2019 06/09/2019 12/08/2018 09/14/2018 06/08/2018  Falls in the past year? 0 0 0 0 0  Number falls in past yr:  - - - 0 -  Injury with Fall? - - - 0 -  Follow up - - - Falls prevention discussed -  Comment - - - get rid of all throw rugs in the house, adequate lighting in the walkways and grab bars in the bathroom -     Objective:  Sheila Ferrell seemed alert and oriented and she participated appropriately during our telephone visit.  Blood Pressure Weight BMI  BP Readings from Last 3 Encounters:  06/09/19 121/64  12/08/18 (!) 162/92  06/08/18 (!) 143/76   Wt Readings from Last 3 Encounters:  06/09/19 179 lb (81.2 kg)  12/08/18 183 lb (83 kg)  06/08/18 183 lb (83 kg)   BMI Readings from Last 1 Encounters:  06/09/19 33.82 kg/m    *Unable to obtain current vital signs, weight, and BMI due to telephone visit type  Hearing/Vision  . Terryl did not seem to have difficulty with hearing/understanding during the telephone conversation . Reports that she has had a formal eye exam by an eye care professional within the past year . Reports that she has not had a formal hearing evaluation within the past year *Unable to fully assess hearing and vision during telephone visit type  Cognitive Function: 6CIT Screen 10/18/2019 09/14/2018  What Year? 0 points 0 points  What month? 0 points 0 points  What time? 0 points 0 points  Count back from 20 0 points 0 points  Months in reverse 0 points 0 points  Repeat phrase 0 points 0 points  Total Score 0 0   (Normal:0-7, Significant for Dysfunction: >8)  Normal Cognitive Function Screening: Yes   Immunization & Health Maintenance Record Immunization History  Administered Date(s) Administered  . Influenza, High Dose Seasonal PF 10/15/2014, 10/16/2015, 10/11/2016, 10/30/2017, 10/15/2019  . Influenza,inj,Quad PF,6+ Mos 10/12/2013  . Moderna SARS-COVID-2 Vaccination 02/02/2019, 03/02/2019  . Pneumococcal Conjugate-13 01/10/2013  . Pneumococcal Polysaccharide-23 05/18/2014  . Tdap 04/11/2013  Health Maintenance  Topic Date Due  . Hepatitis C  Screening  Never done  . MAMMOGRAM  03/03/2019  . TETANUS/TDAP  04/12/2023  . INFLUENZA VACCINE  Completed  . DEXA SCAN  Completed  . COVID-19 Vaccine  Completed  . PNA vac Low Risk Adult  Completed       Assessment  This is a routine wellness examination for SAVINA OLSHEFSKI.  Health Maintenance: Due or Overdue Health Maintenance Due  Topic Date Due  . Hepatitis C Screening  Never done  . MAMMOGRAM  03/03/2019    Sheila Ferrell does not need a referral for Community Assistance: Care Management:   no Social Work:    no Prescription Assistance:  no Nutrition/Diabetes Education:  no   Plan:  Personalized Goals Goals Addressed   None    Personalized Health Maintenance & Screening Recommendations  Screening mammography  Lung Cancer Screening Recommended: no (Low Dose CT Chest recommended if Age 75-80 years, 30 pack-year currently smoking OR have quit w/in past 15 years) Hepatitis C Screening recommended: yes HIV Screening recommended: no  Advanced Directives: Written information was not prepared per patient's request.  Referrals & Orders No orders of the defined types were placed in this encounter.   Follow-up Plan . Follow-up with Sheila Pretty, FNP as planned . Schedule for a mammogram and also have Hep C screening with next blood work during regular follow up    I have personally reviewed and noted the following in the patient's chart:   . Medical and social history . Use of alcohol, tobacco or illicit drugs  . Current medications and supplements . Functional ability and status . Nutritional status . Physical activity . Advanced directives . List of other physicians . Hospitalizations, surgeries, and ER visits in previous 12 months . Vitals . Screenings to include cognitive, depression, and falls . Referrals and appointments  In addition, I have reviewed and discussed with Sheila Ferrell certain preventive protocols, quality metrics, and  best practice recommendations. A written personalized care plan for preventive services as well as general preventive health recommendations is available and can be mailed to the patient at her request.      Rolena Infante LPN 03/50/0938

## 2019-10-18 NOTE — Patient Instructions (Signed)
[name] , Thank you for taking time to come for your Medicare Wellness Visit. I appreciate your ongoing commitment to your health goals. Please review the following plan we discussed and let me know if I can assist you in the future.   These are the goals we discussed: Goals    .  Chronic Disease Management Needs      CARE PLAN ENTRY (see longtitudinal plan of care for additional care plan information)  Current Barriers:  . Chronic Disease Management support, education, and care coordination needs related to Mixed hyperlipidemia, Hypothyroidism,GAD, Vitamin D deficiency, major depressive disorder, hypertension  Clinical Goal(s) related to Mixed hyperlipidemia, Hypothyroidism,GAD, Vitamin D deficiency, major depressive disorder, hypertension:  Over the next 60 days, patient will:  . Work with the care management team to address educational, disease management, and care coordination needs  . Begin or continue self health monitoring activities as directed today Measure and record blood pressure 5 times per week . Call provider office for new or worsened signs and symptoms Blood pressure findings outside established parameters and New or worsened symptom related to chronic medical conditions . Call care management team with questions or concerns . Verbalize basic understanding of patient centered plan of care established today  Interventions related to Mixed hyperlipidemia, Hypothyroidism,GAD, Vitamin D deficiency, major depressive disorder, hypertension:  . Evaluation of current treatment plans and patient's adherence to plan as established by provider . Assessed patient understanding of disease states . Assessed patient's education and care coordination needs . Provided disease specific education to patient  . Collaborated with appropriate clinical care team members regarding patient needs  Patient Self Care Activities related to Mixed hyperlipidemia, Hypothyroidism,GAD, Vitamin D  deficiency, major depressive disorder, hypertension:  . Patient is unable to independently self-manage chronic health conditions  Initial goal documentation     .  Client states she would like to talk with someone about her feelings of sadness and depression (pt-stated)      Current Barriers:  Marland Kitchen Mental Health Challenges . Managing Depression symptoms is challenging  Clinical Social Work Clinical Goal(s):  Marland Kitchen Over the next 30 days, client will work with LCSW to address concerns related to depression of client and management of depression symptoms  Interventions: . Provided patient with information about CCM program services.   Marland Kitchen LCSW talked with client about health needs of her spouse (spouse has Dementia, Parkinson's Disease) . Talked with client about relaxation techniques of choice (used to enjoy traveling,) . Talked with client about sleeping challenges for client.  . Talked with client about fall potential of her spouse . Talked with client about deep breathing exercises . Talked with client about muscles relaxation technique and creative imagery relaxation technique  Patient Self Care Activities:  . Self administers medications as prescribed . Attends all scheduled provider appointments .  Performs ADL's independently    Plan:    Client to attend scheduled medical appointments [place] to use relaxation techniques to help her manage symptoms faced. Client to communicare with RNCM as needed to discuss nursing needs of client LCSW to call client in next 3 weeks to assess psychosocial needs of client  Initial goal documentation    .  DIET - EAT MORE FRUITS AND VEGETABLES    .  DIET - INCREASE WATER INTAKE    .  Exercise 150 minutes per week (moderate activity)      Walk for 30 minutes daily    .  Increase Physical Activity  CARE PLAN ENTRY (see longtitudinal plan of care for additional care plan information)  Current Barriers:  . 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 Chart reviewed . Talked with patient by telephone o She would like to increase her physical activity level but is currently unable to do very much because most of her attention goes to taking care of her disabled husband o She does have [place] services that are supposed to start Monday and should be able to free up some of her time . Discussed increased physical activity and exercise . Discussed weight increase and management . Discussed hypothyroidism. Reviewed medications and recent lab results.  . Encouraged patient to keep f/u with PCP . Provided with CCM contact info and encouraged to reach out as needed  Patient Self Care Activities:  . Performs ADL's independently . Performs IADL's independently  Initial goal documentation     .  LIFESTYLE - EAT BREAKFAST    .  Stress Reduction      CARE PLAN ENTRY (see longtitudinal plan of care for additional care plan information)  Current Barriers:  . Chronic Disease Management support and education needs related to anxiety, depression, and stress management . 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 Chart reviewed including office notes and lab results . Medications reviewed and discussed with patient: Zoloft . Encouraged patient to talk with LCSW regarding stress and caregiver strain . Talked with patient about stress and caregiver strain o [place] is going to start providing in-home services to her husband next week . Collaborated with LCSW. Patient will be scheduled for outreach.  . Provided with CCM contact information and encouraged to reach out as  needed  Patient Self Care Activities:  . Performs ADL's independently . Performs IADL's independently   Initial goal documentation        This is a list of the screening recommended for you and due dates:  Health Maintenance  Topic Date Due  .  Hepatitis C: One time screening is recommended by Center for Disease Control  (CDC) for  adults born from [birth year] through 1965.   Never done  . Mammogram  03/03/2019  . Tetanus Vaccine  04/12/2023  . Flu Shot  Completed  . DEXA scan (bone density measurement)  Completed  . COVID-19 Vaccine  Completed  . Pneumonia vaccines  Completed

## 2019-12-12 ENCOUNTER — Ambulatory Visit (INDEPENDENT_AMBULATORY_CARE_PROVIDER_SITE_OTHER): Payer: Medicare PPO | Admitting: Nurse Practitioner

## 2019-12-12 ENCOUNTER — Other Ambulatory Visit: Payer: Self-pay

## 2019-12-12 ENCOUNTER — Encounter: Payer: Self-pay | Admitting: Nurse Practitioner

## 2019-12-12 ENCOUNTER — Ambulatory Visit (INDEPENDENT_AMBULATORY_CARE_PROVIDER_SITE_OTHER): Payer: Medicare PPO

## 2019-12-12 VITALS — BP 131/75 | HR 96 | Temp 97.7°F | Resp 20 | Ht 61.0 in | Wt 180.0 lb

## 2019-12-12 DIAGNOSIS — Z6834 Body mass index (BMI) 34.0-34.9, adult: Secondary | ICD-10-CM | POA: Diagnosis not present

## 2019-12-12 DIAGNOSIS — E034 Atrophy of thyroid (acquired): Secondary | ICD-10-CM | POA: Diagnosis not present

## 2019-12-12 DIAGNOSIS — E559 Vitamin D deficiency, unspecified: Secondary | ICD-10-CM | POA: Diagnosis not present

## 2019-12-12 DIAGNOSIS — I10 Essential (primary) hypertension: Secondary | ICD-10-CM | POA: Diagnosis not present

## 2019-12-12 DIAGNOSIS — M810 Age-related osteoporosis without current pathological fracture: Secondary | ICD-10-CM | POA: Diagnosis not present

## 2019-12-12 DIAGNOSIS — E782 Mixed hyperlipidemia: Secondary | ICD-10-CM

## 2019-12-12 DIAGNOSIS — F411 Generalized anxiety disorder: Secondary | ICD-10-CM

## 2019-12-12 DIAGNOSIS — F3342 Major depressive disorder, recurrent, in full remission: Secondary | ICD-10-CM | POA: Diagnosis not present

## 2019-12-12 DIAGNOSIS — M85852 Other specified disorders of bone density and structure, left thigh: Secondary | ICD-10-CM | POA: Diagnosis not present

## 2019-12-12 MED ORDER — ATORVASTATIN CALCIUM 20 MG PO TABS: 20.0000 mg | ORAL_TABLET | Freq: Every day | ORAL | 1 refills | Status: DC

## 2019-12-12 MED ORDER — SERTRALINE HCL 100 MG PO TABS
150.0000 mg | ORAL_TABLET | Freq: Every day | ORAL | 1 refills | Status: DC
Start: 2019-12-12 — End: 2020-06-11

## 2019-12-12 MED ORDER — LEVOTHYROXINE SODIUM 112 MCG PO TABS: 112.0000 ug | ORAL_TABLET | Freq: Every day | ORAL | 1 refills | Status: DC

## 2019-12-12 MED ORDER — LISINOPRIL 20 MG PO TABS: 20.0000 mg | ORAL_TABLET | Freq: Every day | ORAL | 1 refills | Status: DC

## 2019-12-12 NOTE — Progress Notes (Signed)
Subjective:    Patient ID: Sheila Ferrell, female    DOB: 11/08/1940, 79 y.o.   MRN: [id number]   Chief Complaint: medical management of chronic issues     HPI:  1. Mixed hyperlipidemia Does not watch diet and does no dedicated exercise. Is on lipitor 20mg  daily. Lab Results  Component Value Date   CHOL 117 06/09/2019   HDL 43 06/09/2019   LDLCALC 55 06/09/2019   TRIG 103 06/09/2019   CHOLHDL 2.7 06/09/2019     2. Hypothyroidism due to acquired atrophy of thyroid Is on levothyroxine 14mcg daily. Is having no problems that she is aware of. Lab Results  Component Value Date   TSH 0.831 06/09/2019     3. GAD (generalized anxiety disorder) *is doing well. Has occasional anxiety. Has xanax 0.25 to take as needed. She only took for awhile after the death of her husband. GAD 7 : Generalized Anxiety Score 12/12/2019 06/09/2019 12/08/2018 06/09/2018  Nervous, Anxious, on Edge 1 0 1 0  Control/stop worrying 2 1 0 2  Worry too much - different things 1 1 0 3  Trouble relaxing 3 2 1 2   Restless 1 3 0 1  Easily annoyed or irritable 0 0 1 1  Afraid - awful might happen 2 1 0 0  Total GAD 7 Score 10 8 3 9   Anxiety Difficulty Somewhat difficult Somewhat difficult Not difficult at all Somewhat difficult     4. Recurrent major depressive disorder, in full remission ([place]) Is currently on zoloft 100mg  and is not doing very well since her husband passed away.  Depression screen [facility] 2/9 12/12/2019 06/09/2019 12/08/2018  Decreased Interest 3 0 0  Down, Depressed, Hopeless 3 0 0  PHQ - 2 Score 6 0 0  Altered sleeping 1 - -  Tired, decreased energy 3 - -  Change in appetite 3 - -  Feeling bad or failure about yourself  0 - -  Trouble concentrating 3 - -  Moving slowly or fidgety/restless 0 - -  Suicidal thoughts 0 - -  PHQ-9 Score 16 - -  Difficult doing work/chores Not difficult at all - -  Some recent data might be hidden     5. Age-related osteoporosis without current pathological  fracture Last dexascan was done 09/09/16. t score was -2.2. she takes a dilay vitamin d supplement. Does no weight bearing exercises.  6. Vitamin D deficiency On daily vitamin d supplement  7. BMI 33.0-33.9,adult No recent weight changes Wt Readings from Last 3 Encounters:  12/12/19 180 lb (81.6 kg)  06/09/19 179 lb (81.2 kg)  12/08/18 183 lb (83 kg)   . BMI Readings from Last 3 Encounters:  12/12/19 34.01 kg/m  06/09/19 33.82 kg/m  12/08/18 34.58 kg/m       Outpatient Encounter Medications as of 12/12/2019  Medication Sig  . ALPRAZolam (XANAX) 0.25 MG tablet Take 1 tablet (0.25 mg total) by mouth 2 (two) times daily as needed for anxiety.  Marland Kitchen ascorbic acid (VITAMIN C) 1000 MG tablet Take 1,000 mg by mouth daily.  Marland Kitchen atorvastatin (LIPITOR) 20 MG tablet Take 1 tablet (20 mg total) by mouth daily.  . Cholecalciferol (VITAMIN D-3) 1000 UNITS CAPS Take by mouth daily.  Marland Kitchen levothyroxine (LEVOXYL) 112 MCG tablet Take 1 tablet (112 mcg total) by mouth daily before breakfast.  . lisinopril (ZESTRIL) 20 MG tablet Take 1 tablet (20 mg total) by mouth daily.  . sertraline (ZOLOFT) 100 MG tablet Take 1 tablet (100  mg total) by mouth daily.     Past Surgical History:  Procedure Laterality Date  . EYE SURGERY Bilateral 2017   had cataract surgery in both eyes in the same year  . KNEE SURGERY    . NSVD  1963  . NSVD  1968  . NSVD  1971  . NSVD  1977  . TUBAL LIGATION  1977   BILATERAL      Family History  Problem Relation Age of Onset  . Melanoma Brother   . Deep vein thrombosis Brother   . Hypertension Mother   . Stroke Mother   . Diabetes Paternal Grandfather   . Cancer Father        leukemia  . Healthy Daughter   . Healthy Son   . Heart disease Brother   . Healthy Daughter   . Hypothyroidism Daughter     New complaints: None otday  Social history: Husband died this past year. Her family tries to check on her daily.  Controlled substance contract:  n/a    Review of Systems  Constitutional: Negative for diaphoresis.  Eyes: Negative for pain.  Respiratory: Negative for shortness of breath.   Cardiovascular: Negative for chest pain, palpitations and leg swelling.  Gastrointestinal: Negative for abdominal pain.  Endocrine: Negative for polydipsia.  Skin: Negative for rash.  Neurological: Negative for dizziness, weakness and headaches.  Hematological: Does not bruise/bleed easily.  All other systems reviewed and are negative.      Objective:   Physical Exam Vitals and nursing note reviewed.  Constitutional:      General: She is not in acute distress.    Appearance: Normal appearance. She is well-developed.  HENT:     Head: Normocephalic.     Right Ear: There is impacted cerumen.     Left Ear: There is impacted cerumen.     Nose: Nose normal.  Eyes:     Pupils: Pupils are equal, round, and reactive to light.  Neck:     Vascular: No carotid bruit or JVD.  Cardiovascular:     Rate and Rhythm: Normal rate and regular rhythm.     Heart sounds: Normal heart sounds.  Pulmonary:     Effort: Pulmonary effort is normal. No respiratory distress.     Breath sounds: Normal breath sounds. No wheezing or rales.  Chest:     Chest wall: No tenderness.  Abdominal:     General: Bowel sounds are normal. There is no distension or abdominal bruit.     Palpations: Abdomen is soft. There is no hepatomegaly, splenomegaly, mass or pulsatile mass.     Tenderness: There is no abdominal tenderness.  Musculoskeletal:        General: Normal range of motion.     Cervical back: Normal range of motion and neck supple.  Lymphadenopathy:     Cervical: No cervical adenopathy.  Skin:    General: Skin is warm and dry.  Neurological:     Mental Status: She is alert and oriented to person, place, and time.     Deep Tendon Reflexes: Reflexes are normal and symmetric.  Psychiatric:        Behavior: Behavior normal.        Thought Content: Thought  content normal.        Judgment: Judgment normal.    BP 131/75   Pulse 96   Temp 97.7 F (36.5 C) (Temporal)   Resp 20   Ht 5\' 1"  (1.549 m)   Wt 180 lb (  81.6 kg)   SpO2 97%   BMI 34.01 kg/m   S/P bil ear irriagation- TM's clear bil      Assessment & Plan:  Sheila Ferrell comes in today with chief complaint of Medical Management of Chronic Issues   Diagnosis and orders addressed:  1. Mixed hyperlipidemia Low fat diet - atorvastatin (LIPITOR) 20 MG tablet; Take 1 tablet (20 mg total) by mouth daily.  Dispense: 90 tablet; Refill: 1  2. Hypothyroidism due to acquired atrophy of thyroid Labs pending - levothyroxine (LEVOXYL) 112 MCG tablet; Take 1 tablet (112 mcg total) by mouth daily before breakfast.  Dispense: 90 tablet; Refill: 1  3. GAD (generalized anxiety disorder) stress management  4. Recurrent major depressive disorder, in full remission ([place]) Increased zoloft to 150mg  daily  5. Age-related osteoporosis without current pathological fracture Weight bearing exercises encouraged Will repeat dexascan today  6. Vitamin D deficiency Continue daily vitamin d supplement  7. BMI 34.0-34.9,adult Discussed diet and exercise for person with BMI >25 Will recheck weight in 3-6 months   8. Primary hypertension Low sodium diet - lisinopril (ZESTRIL) 20 MG tablet; Take 1 tablet (20 mg total) by mouth daily.  Dispense: 90 tablet; Refill: 1   Labs pending Health Maintenance reviewed Diet and exercise encouraged  Follow up plan: 6 months   Mary-Margaret Hassell Done, FNP

## 2019-12-12 NOTE — Patient Instructions (Signed)
Managing Loss, Adult People experience loss in many different ways throughout their lives. Events such as moving, changing jobs, and losing friends can create a sense of loss. The loss may be as serious as a major health change, divorce, death of a pet, or death of a loved one. All of these types of loss are likely to create a physical and emotional reaction known as grief. Grief is the result of a major change or an absence of something or someone that you count on. Grief is a normal reaction to loss. A variety of factors can affect your grieving experience, including:  The nature of your loss.  Your relationship to what or whom you lost.  Your understanding of grief and how to manage it.  Your support system. How to manage lifestyle changes Keep to your normal routine as much as possible.  If you have trouble focusing or doing normal activities, it is acceptable to take some time away from your normal routine.  Spend time with friends and loved ones.  Eat a healthy diet, get plenty of sleep, and rest when you feel tired. How to recognize changes  The way that you deal with your grief will affect your ability to function as you normally do. When grieving, you may experience these changes:  Numbness, shock, sadness, anxiety, anger, denial, and guilt.  Thoughts about death.  Unexpected crying.  A physical sensation of emptiness in your stomach.  Problems sleeping and eating.  Tiredness (fatigue).  Loss of interest in normal activities.  Dreaming about or imagining seeing the person who died.  A need to remember what or whom you lost.  Difficulty thinking about anything other than your loss for a period of time.  Relief. If you have been expecting the loss for a while, you may feel a sense of relief when it happens. Follow these instructions at home:  Activity Express your feelings in healthy ways, such as:  Talking with others about your loss. It may be helpful to find  others who have had a similar loss, such as a support group.  Writing down your feelings in a journal.  Doing physical activities to release stress and emotional energy.  Doing creative activities like painting, sculpting, or playing or listening to music.  Practicing resilience. This is the ability to recover and adjust after facing challenges. Reading some resources that encourage resilience may help you to learn ways to practice those behaviors. General instructions  Be patient with yourself and others. Allow the grieving process to happen, and remember that grieving takes time. ? It is likely that you may never feel completely done with some grief. You may find a way to move on while still cherishing memories and feelings about your loss. ? Accepting your loss is a process. It can take months or longer to adjust.  Keep all follow-up visits as told by your health care provider. This is important. Where to find support To get support for managing loss:  Ask your health care provider for help and recommendations, such as grief counseling or therapy.  Think about joining a support group for people who are managing a loss. Where to find more information You can find more information about managing loss from:  American Society of Clinical Oncology: www.cancer.net  American Psychological Association: www.apa.org Contact a health care provider if:  Your grief is extreme and keeps getting worse.  You have ongoing grief that does not improve.  Your body shows symptoms of grief, such   as illness.  You feel depressed, anxious, or lonely. Get help right away if:  You have thoughts about hurting yourself or others. If you ever feel like you may hurt yourself or others, or have thoughts about taking your own life, get help right away. You can go to your nearest emergency department or call:  Your local emergency services (911 in the [place]  A suicide crisis helpline, such as the  National Suicide Prevention Lifeline at 1-800-273-8255. This is open 24 hours a day. Summary  Grief is the result of a major change or an absence of someone or something that you count on. Grief is a normal reaction to loss.  The depth of grief and the period of recovery depend on the type of loss and your ability to adjust to the change and process your feelings.  Processing grief requires patience and a willingness to accept your feelings and talk about your loss with people who are supportive.  It is important to find resources that work for you and to realize that people experience grief differently. There is not one grieving process that works for everyone in the same way.  Be aware that when grief becomes extreme, it can lead to more severe issues like isolation, depression, anxiety, or suicidal thoughts. Talk with your health care provider if you have any of these issues. This information is not intended to replace advice given to you by your health care provider. Make sure you discuss any questions you have with your health care provider. Document Revised: 02/26/2018 Document Reviewed: 05/08/2016 Elsevier Patient Education  2020 [employer]  

## 2019-12-13 ENCOUNTER — Other Ambulatory Visit: Payer: Self-pay | Admitting: Nurse Practitioner

## 2019-12-13 DIAGNOSIS — Z1231 Encounter for screening mammogram for malignant neoplasm of breast: Secondary | ICD-10-CM

## 2019-12-13 LAB — CBC WITH DIFFERENTIAL/PLATELET
Basophils Absolute: 0.1 10*3/uL (ref 0.0–0.2)
Basos: 2 %
EOS (ABSOLUTE): 0.3 10*3/uL (ref 0.0–0.4)
Eos: 5 %
Hematocrit: 43.2 % (ref 34.0–46.6)
Hemoglobin: 14.1 g/dL (ref 11.1–15.9)
Immature Grans (Abs): 0 10*3/uL (ref 0.0–0.1)
Immature Granulocytes: 0 %
Lymphocytes Absolute: 1.6 10*3/uL (ref 0.7–3.1)
Lymphs: 26 %
MCH: 31.7 pg (ref 26.6–33.0)
MCHC: 32.6 g/dL (ref 31.5–35.7)
MCV: 97 fL (ref 79–97)
Monocytes Absolute: 0.3 10*3/uL (ref 0.1–0.9)
Monocytes: 6 %
Neutrophils Absolute: 3.6 10*3/uL (ref 1.4–7.0)
Neutrophils: 61 %
Platelets: 323 10*3/uL (ref 150–450)
RBC: 4.45 x10E6/uL (ref 3.77–5.28)
RDW: 12 % (ref 11.7–15.4)
WBC: 5.9 10*3/uL (ref 3.4–10.8)

## 2019-12-13 LAB — THYROID PANEL WITH TSH
Free Thyroxine Index: 2.1 (ref 1.2–4.9)
T3 Uptake Ratio: 28 % (ref 24–39)
T4, Total: 7.5 ug/dL (ref 4.5–12.0)
TSH: 1.7 u[IU]/mL (ref 0.450–4.500)

## 2019-12-13 LAB — LIPID PANEL
Chol/HDL Ratio: 3.5 ratio (ref 0.0–4.4)
Cholesterol, Total: 170 mg/dL (ref 100–199)
HDL: 49 mg/dL (ref >39)
LDL Chol Calc (NIH): 90 mg/dL (ref 0–99)
Triglycerides: 182 mg/dL — ABNORMAL HIGH (ref 0–149)
VLDL Cholesterol Cal: 31 mg/dL (ref 5–40)

## 2019-12-13 LAB — CMP14+EGFR
ALT: 23 IU/L (ref 0–32)
AST: 20 IU/L (ref 0–40)
Albumin/Globulin Ratio: 1.7 (ref 1.2–2.2)
Albumin: 4.8 g/dL — ABNORMAL HIGH (ref 3.7–4.7)
Alkaline Phosphatase: 116 IU/L (ref 44–121)
BUN/Creatinine Ratio: 19 (ref 12–28)
BUN: 15 mg/dL (ref 8–27)
Bilirubin Total: 0.4 mg/dL (ref 0.0–1.2)
CO2: 25 mmol/L (ref 20–29)
Calcium: 9.8 mg/dL (ref 8.7–10.3)
Chloride: 101 mmol/L (ref 96–106)
Creatinine, Ser: 0.79 mg/dL (ref 0.57–1.00)
GFR calc Af Amer: 82 mL/min/{1.73_m2} (ref >59)
GFR calc non Af Amer: 71 mL/min/{1.73_m2} (ref >59)
Globulin, Total: 2.8 g/dL (ref 1.5–4.5)
Glucose: 95 mg/dL (ref 65–99)
Potassium: 5.2 mmol/L (ref 3.5–5.2)
Sodium: 142 mmol/L (ref 134–144)
Total Protein: 7.6 g/dL (ref 6.0–8.5)

## 2019-12-14 ENCOUNTER — Ambulatory Visit
Admission: RE | Admit: 2019-12-14 | Discharge: 2019-12-14 | Disposition: A | Discharge status: Home / Self Care | Payer: Medicare Other | Source: Ambulatory Visit | Attending: Nurse Practitioner | Admitting: Nurse Practitioner

## 2019-12-14 ENCOUNTER — Other Ambulatory Visit: Payer: Self-pay

## 2019-12-14 DIAGNOSIS — Z1231 Encounter for screening mammogram for malignant neoplasm of breast: Secondary | ICD-10-CM

## 2020-01-19 DIAGNOSIS — M75102 Unspecified rotator cuff tear or rupture of left shoulder, not specified as traumatic: Secondary | ICD-10-CM | POA: Diagnosis not present

## 2020-06-11 ENCOUNTER — Encounter: Payer: Self-pay | Admitting: Nurse Practitioner

## 2020-06-11 ENCOUNTER — Ambulatory Visit (INDEPENDENT_AMBULATORY_CARE_PROVIDER_SITE_OTHER): Payer: Medicare PPO | Admitting: Nurse Practitioner

## 2020-06-11 ENCOUNTER — Other Ambulatory Visit: Payer: Self-pay

## 2020-06-11 VITALS — BP 119/75 | HR 78 | Temp 97.2°F | Resp 20 | Ht 61.0 in | Wt 187.0 lb

## 2020-06-11 DIAGNOSIS — M810 Age-related osteoporosis without current pathological fracture: Secondary | ICD-10-CM

## 2020-06-11 DIAGNOSIS — E782 Mixed hyperlipidemia: Secondary | ICD-10-CM | POA: Diagnosis not present

## 2020-06-11 DIAGNOSIS — Z6835 Body mass index (BMI) 35.0-35.9, adult: Secondary | ICD-10-CM

## 2020-06-11 DIAGNOSIS — I10 Essential (primary) hypertension: Secondary | ICD-10-CM

## 2020-06-11 DIAGNOSIS — F411 Generalized anxiety disorder: Secondary | ICD-10-CM | POA: Diagnosis not present

## 2020-06-11 DIAGNOSIS — F3342 Major depressive disorder, recurrent, in full remission: Secondary | ICD-10-CM

## 2020-06-11 DIAGNOSIS — E034 Atrophy of thyroid (acquired): Secondary | ICD-10-CM | POA: Diagnosis not present

## 2020-06-11 DIAGNOSIS — E559 Vitamin D deficiency, unspecified: Secondary | ICD-10-CM

## 2020-06-11 LAB — LIPID PANEL
Chol/HDL Ratio: 3.9 ratio (ref 0.0–4.4)
Cholesterol, Total: 166 mg/dL (ref 100–199)
HDL: 43 mg/dL (ref >39)
LDL Chol Calc (NIH): 94 mg/dL (ref 0–99)
Triglycerides: 165 mg/dL — ABNORMAL HIGH (ref 0–149)
VLDL Cholesterol Cal: 29 mg/dL (ref 5–40)

## 2020-06-11 LAB — CMP14+EGFR
ALT: 17 IU/L (ref 0–32)
AST: 15 IU/L (ref 0–40)
Albumin/Globulin Ratio: 1.8 (ref 1.2–2.2)
Albumin: 4.7 g/dL (ref 3.7–4.7)
Alkaline Phosphatase: 111 IU/L (ref 44–121)
BUN/Creatinine Ratio: 19 (ref 12–28)
BUN: 17 mg/dL (ref 8–27)
Bilirubin Total: 0.4 mg/dL (ref 0.0–1.2)
CO2: 27 mmol/L (ref 20–29)
Calcium: 10.3 mg/dL (ref 8.7–10.3)
Chloride: 100 mmol/L (ref 96–106)
Creatinine, Ser: 0.89 mg/dL (ref 0.57–1.00)
Globulin, Total: 2.6 g/dL (ref 1.5–4.5)
Glucose: 97 mg/dL (ref 65–99)
Potassium: 5.1 mmol/L (ref 3.5–5.2)
Sodium: 141 mmol/L (ref 134–144)
Total Protein: 7.3 g/dL (ref 6.0–8.5)
eGFR: 65 mL/min/{1.73_m2} (ref >59)

## 2020-06-11 LAB — CBC WITH DIFFERENTIAL/PLATELET
Basophils Absolute: 0.1 10*3/uL (ref 0.0–0.2)
Basos: 1 %
EOS (ABSOLUTE): 0.5 10*3/uL — ABNORMAL HIGH (ref 0.0–0.4)
Eos: 7 %
Hematocrit: 41.4 % (ref 34.0–46.6)
Hemoglobin: 13.5 g/dL (ref 11.1–15.9)
Immature Grans (Abs): 0 10*3/uL (ref 0.0–0.1)
Immature Granulocytes: 0 %
Lymphocytes Absolute: 1.5 10*3/uL (ref 0.7–3.1)
Lymphs: 25 %
MCH: 31.5 pg (ref 26.6–33.0)
MCHC: 32.6 g/dL (ref 31.5–35.7)
MCV: 97 fL (ref 79–97)
Monocytes Absolute: 0.5 10*3/uL (ref 0.1–0.9)
Monocytes: 8 %
Neutrophils Absolute: 3.6 10*3/uL (ref 1.4–7.0)
Neutrophils: 59 %
Platelets: 286 10*3/uL (ref 150–450)
RBC: 4.29 x10E6/uL (ref 3.77–5.28)
RDW: 12.3 % (ref 11.7–15.4)
WBC: 6.2 10*3/uL (ref 3.4–10.8)

## 2020-06-11 MED ORDER — LEVOTHYROXINE SODIUM 112 MCG PO TABS: 112.0000 ug | ORAL_TABLET | Freq: Every day | ORAL | 1 refills | Status: DC

## 2020-06-11 MED ORDER — LISINOPRIL 20 MG PO TABS: 20.0000 mg | ORAL_TABLET | Freq: Every day | ORAL | 1 refills | Status: DC

## 2020-06-11 MED ORDER — SERTRALINE HCL 100 MG PO TABS: 150.0000 mg | ORAL_TABLET | Freq: Every day | ORAL | 1 refills | Status: DC

## 2020-06-11 MED ORDER — ATORVASTATIN CALCIUM 20 MG PO TABS: 20.0000 mg | ORAL_TABLET | Freq: Every day | ORAL | 1 refills | Status: DC

## 2020-06-11 NOTE — Patient Instructions (Signed)

## 2020-06-11 NOTE — Addendum Note (Signed)
Addended by: Rolena Infante on: 06/11/2020 11:01 AM   Modules accepted: Orders

## 2020-06-11 NOTE — Progress Notes (Signed)
Subjective:    Patient ID: Sheila Ferrell, female    DOB: [birth year], 80 y.o.   MRN: [id number]   Chief Complaint: Medical Management of Chronic Issues    HPI:  1. Primary hypertension No c/o chest pain, sob or headache. Does not check blood pressure at home. BP Readings from Last 3 Encounters:  06/11/20 119/75  12/12/19 131/75  06/09/19 121/64     2. Mixed hyperlipidemia Tries to watch diet but does no dedicated exercise. Lab Results  Component Value Date   CHOL 170 12/12/2019   HDL 49 12/12/2019   LDLCALC 90 12/12/2019   TRIG 182 (H) 12/12/2019   CHOLHDL 3.5 12/12/2019     3. Hypothyroidism due to acquired atrophy of thyroid No problems that she is aware of. Lab Results  Component Value Date   TSH 1.700 12/12/2019     4. Recurrent major depressive disorder, in full remission ([place]) Is currently on zoloft and is doing well. Depression screen [facility] 2/9 06/11/2020 12/12/2019 06/09/2019  Decreased Interest 1 3 0  Down, Depressed, Hopeless 1 3 0  PHQ - 2 Score 2 6 0  Altered sleeping 2 1 -  Tired, decreased energy 2 3 -  Change in appetite 1 3 -  Feeling bad or failure about yourself  1 0 -  Trouble concentrating 1 3 -  Moving slowly or fidgety/restless 0 0 -  Suicidal thoughts 0 0 -  PHQ-9 Score 9 16 -  Difficult doing work/chores Not difficult at all Not difficult at all -  Some recent data might be hidden     5. GAD (generalized anxiety disorder) Is on xanax but does not take very often. GAD 7 : Generalized Anxiety Score 06/11/2020 12/12/2019 06/09/2019 12/08/2018  Nervous, Anxious, on Edge 0 1 0 1  Control/stop worrying 0 2 1 0  Worry too much - different things 0 1 1 0  Trouble relaxing 1 3 2 1   Restless 0 1 3 0  Easily annoyed or irritable 0 0 0 1  Afraid - awful might happen 0 2 1 0  Total GAD 7 Score 1 10 8 3   Anxiety Difficulty Not difficult at all Somewhat difficult Somewhat difficult Not difficult at all      6. Age-related osteoporosis without  current pathological fracture Does no weight bearing exercises. Last dexascan was done 12/12/19 with t score of -2.2.   7. Vitamin D deficiency Is on daily vitamin d supplement  8. BMI 33.0-33.9,adult Weight is up 7 lbs from previous visit Wt Readings from Last 3 Encounters:  06/11/20 187 lb (84.8 kg)  12/12/19 180 lb (81.6 kg)  06/09/19 179 lb (81.2 kg)   BMI Readings from Last 3 Encounters:  06/11/20 35.33 kg/m  12/12/19 34.01 kg/m  06/09/19 33.82 kg/m       Outpatient Encounter Medications as of 06/11/2020  Medication Sig  . ALPRAZolam (XANAX) 0.25 MG tablet Take 1 tablet (0.25 mg total) by mouth 2 (two) times daily as needed for anxiety.  Marland Kitchen ascorbic acid (VITAMIN C) 1000 MG tablet Take 1,000 mg by mouth daily.  Marland Kitchen atorvastatin (LIPITOR) 20 MG tablet Take 1 tablet (20 mg total) by mouth daily.  . Cholecalciferol (VITAMIN D-3) 1000 UNITS CAPS Take by mouth daily.  Marland Kitchen levothyroxine (LEVOXYL) 112 MCG tablet Take 1 tablet (112 mcg total) by mouth daily before breakfast.  . lisinopril (ZESTRIL) 20 MG tablet Take 1 tablet (20 mg total) by mouth daily.  . sertraline (ZOLOFT) 100 MG  tablet Take 1.5 tablets (150 mg total) by mouth daily.   No facility-administered encounter medications on file as of 06/11/2020.    Past Surgical History:  Procedure Laterality Date  . EYE SURGERY Bilateral 2017   had cataract surgery in both eyes in the same year  . KNEE SURGERY    . NSVD  1963  . NSVD  1968  . NSVD  1971  . NSVD  1977  . TUBAL LIGATION  1977   BILATERAL      Family History  Problem Relation Age of Onset  . Melanoma Brother   . Deep vein thrombosis Brother   . Hypertension Mother   . Stroke Mother   . Diabetes Paternal Grandfather   . Cancer Father        leukemia  . Healthy Daughter   . Healthy Son   . Heart disease Brother   . Healthy Daughter   . Hypothyroidism Daughter     New complaints: None today  Social history: She lives with by herself. She has family  that checks on her daily  Controlled substance contract: n/a    Review of Systems  Constitutional: Negative for diaphoresis.  Eyes: Negative for pain.  Respiratory: Negative for shortness of breath.   Cardiovascular: Negative for chest pain, palpitations and leg swelling.  Gastrointestinal: Negative for abdominal pain.  Endocrine: Negative for polydipsia.  Skin: Negative for rash.  Neurological: Negative for dizziness, weakness and headaches.  Hematological: Does not bruise/bleed easily.  All other systems reviewed and are negative.      Objective:   Physical Exam Vitals and nursing note reviewed.  Constitutional:      General: She is not in acute distress.    Appearance: Normal appearance. She is well-developed.  HENT:     Head: Normocephalic.     Nose: Nose normal.  Eyes:     Pupils: Pupils are equal, round, and reactive to light.  Neck:     Vascular: No carotid bruit or JVD.  Cardiovascular:     Rate and Rhythm: Normal rate and regular rhythm.     Heart sounds: Normal heart sounds.  Pulmonary:     Effort: Pulmonary effort is normal. No respiratory distress.     Breath sounds: Normal breath sounds. No wheezing or rales.  Chest:     Chest wall: No tenderness.  Abdominal:     General: Bowel sounds are normal. There is no distension or abdominal bruit.     Palpations: Abdomen is soft. There is no hepatomegaly, splenomegaly, mass or pulsatile mass.     Tenderness: There is no abdominal tenderness.  Musculoskeletal:        General: Normal range of motion.     Cervical back: Normal range of motion and neck supple.  Lymphadenopathy:     Cervical: No cervical adenopathy.  Skin:    General: Skin is warm and dry.  Neurological:     Mental Status: She is alert and oriented to person, place, and time.     Deep Tendon Reflexes: Reflexes are normal and symmetric.  Psychiatric:        Behavior: Behavior normal.        Thought Content: Thought content normal.         Judgment: Judgment normal.     BP 119/75   Pulse 78   Temp (!) 97.2 F (36.2 C) (Temporal)   Resp 20   Ht 5\' 1"  (1.549 m)   Wt 187 lb (84.8 kg)  SpO2 96%   BMI 35.33 kg/m        Assessment & Plan:  Sheila Ferrell comes in today with chief complaint of Medical Management of Chronic Issues   Diagnosis and orders addressed:  1. Primary hypertension Low sodium diet - lisinopril (ZESTRIL) 20 MG tablet; Take 1 tablet (20 mg total) by mouth daily.  Dispense: 90 tablet; Refill: 1  2. Mixed hyperlipidemia Low fat diet - atorvastatin (LIPITOR) 20 MG tablet; Take 1 tablet (20 mg total) by mouth daily.  Dispense: 90 tablet; Refill: 1  3. Hypothyroidism due to acquired atrophy of thyroid Labs pending - levothyroxine (LEVOXYL) 112 MCG tablet; Take 1 tablet (112 mcg total) by mouth daily before breakfast.  Dispense: 90 tablet; Refill: 1  4. Recurrent major depressive disorder, in full remission ([place]) Stress management  5. GAD (generalized anxiety disorder)  6. Age-related osteoporosis without current pathological fracture Weight bearing exercises  7. Vitamin D deficiency Daily vitamin d supplement  8. BMI 35.0-35.9,adult Discussed diet and exercise for person with BMI >25 Will recheck weight in 3-6 months   Labs pending Health Maintenance reviewed Diet and exercise encouraged  Follow up plan: 6 months   Mary-Margaret Hassell Done, FNP

## 2020-10-18 ENCOUNTER — Ambulatory Visit (INDEPENDENT_AMBULATORY_CARE_PROVIDER_SITE_OTHER): Payer: Medicare PPO

## 2020-10-18 VITALS — Ht 61.0 in | Wt 180.0 lb

## 2020-10-18 DIAGNOSIS — Z0000 Encounter for general adult medical examination without abnormal findings: Secondary | ICD-10-CM

## 2020-10-18 NOTE — Patient Instructions (Signed)
Sheila Ferrell , Thank you for taking time to come for your Medicare Wellness Visit. I appreciate your ongoing commitment to your health goals. Please review the following plan we discussed and let me know if I can assist you in the future.   Screening recommendations/referrals: Colonoscopy: No longer required Mammogram: Done 12/14/2019 - Repeat annually *appointment mobile unit at [facility] 01/02/21 @ 2pm Bone Density: Done 12/12/2019 - Repeat every 2 years  Recommended yearly ophthalmology/optometry visit for glaucoma screening and checkup Recommended yearly dental visit for hygiene and checkup  Vaccinations: Influenza vaccine: Done 10/13/2020 - Repeat annually Pneumococcal vaccine: Done 01/10/2013 & 02/17/2014 Tdap vaccine: Done 04/11/2013 - Repeat in 10 years Shingles vaccine: Done 06/23/2020 & 08/24/2020   Covid-19: Done 02/02/2019 & 03/02/2019 - declines boosters  Advanced directives: Please bring a copy of your health care power of attorney and living will to the office to be added to your chart at your convenience.   Conditions/risks identified: Aim for 30 minutes of exercise or brisk walking each day, drink 6-8 glasses of water and eat lots of fruits and vegetables.   Next appointment: Follow up in one year for your annual wellness visit    Preventive Care 65 Years and Older, Female Preventive care refers to lifestyle choices and visits with your health care provider that can promote health and wellness. What does preventive care include? A yearly physical exam. This is also called an annual well check. Dental exams once or twice a year. Routine eye exams. Ask your health care provider how often you should have your eyes checked. Personal lifestyle choices, including: Daily care of your teeth and gums. Regular physical activity. Eating a healthy diet. Avoiding tobacco and drug use. Limiting alcohol use. Practicing safe sex. Taking low-dose aspirin every day. Taking vitamin and mineral  supplements as recommended by your health care provider. What happens during an annual well check? The services and screenings done by your health care provider during your annual well check will depend on your age, overall health, lifestyle risk factors, and family history of disease. Counseling  Your health care provider may ask you questions about your: Alcohol use. Tobacco use. Drug use. Emotional well-being. Home and relationship well-being. Sexual activity. Eating habits. History of falls. Memory and ability to understand (cognition). Work and work Statistician. Reproductive health. Screening  You may have the following tests or measurements: Height, weight, and BMI. Blood pressure. Lipid and cholesterol levels. These may be checked every 5 years, or more frequently if you are over 56 years old. Skin check. Lung cancer screening. You may have this screening every year starting at age 3 if you have a 30-pack-year history of smoking and currently smoke or have quit within the past 15 years. Fecal occult blood test (FOBT) of the stool. You may have this test every year starting at age 66. Flexible sigmoidoscopy or colonoscopy. You may have a sigmoidoscopy every 5 years or a colonoscopy every 10 years starting at age 3. Hepatitis C blood test. Hepatitis B blood test. Sexually transmitted disease (STD) testing. Diabetes screening. This is done by checking your blood sugar (glucose) after you have not eaten for a while (fasting). You may have this done every 1-3 years. Bone density scan. This is done to screen for osteoporosis. You may have this done starting at age 63. Mammogram. This may be done every 1-2 years. Talk to your health care provider about how often you should have regular mammograms. Talk with your health care provider about  your test results, treatment options, and if necessary, the need for more tests. Vaccines  Your health care provider may recommend certain  vaccines, such as: Influenza vaccine. This is recommended every year. Tetanus, diphtheria, and acellular pertussis (Tdap, Td) vaccine. You may need a Td booster every 10 years. Zoster vaccine. You may need this after age 28. Pneumococcal 13-valent conjugate (PCV13) vaccine. One dose is recommended after age 76. Pneumococcal polysaccharide (PPSV23) vaccine. One dose is recommended after age 36. Talk to your health care provider about which screenings and vaccines you need and how often you need them. This information is not intended to replace advice given to you by your health care provider. Make sure you discuss any questions you have with your health care provider. Document Released: 01/19/2015 Document Revised: 09/12/2015 Document Reviewed: 10/24/2014 Elsevier Interactive Patient Education  2017 [employer] Prevention in the Home Falls can cause injuries. They can happen to people of all ages. There are many things you can do to make your home safe and to help prevent falls. What can I do on the outside of my home? Regularly fix the edges of walkways and driveways and fix any cracks. Remove anything that might make you trip as you walk through a door, such as a raised step or threshold. Trim any bushes or trees on the path to your home. Use bright outdoor lighting. Clear any walking paths of anything that might make someone trip, such as rocks or tools. Regularly check to see if handrails are loose or broken. Make sure that both sides of any steps have handrails. Any raised decks and porches should have guardrails on the edges. Have any leaves, snow, or ice cleared regularly. Use sand or salt on walking paths during winter. Clean up any spills in your garage right away. This includes oil or grease spills. What can I do in the bathroom? Use night lights. Install grab bars by the toilet and in the tub and shower. Do not use towel bars as grab bars. Use non-skid mats or decals in  the tub or shower. If you need to sit down in the shower, use a plastic, non-slip stool. Keep the floor dry. Clean up any water that spills on the floor as soon as it happens. Remove soap buildup in the tub or shower regularly. Attach bath mats securely with double-sided non-slip rug tape. Do not have throw rugs and other things on the floor that can make you trip. What can I do in the bedroom? Use night lights. Make sure that you have a light by your bed that is easy to reach. Do not use any sheets or blankets that are too big for your bed. They should not hang down onto the floor. Have a firm chair that has side arms. You can use this for support while you get dressed. Do not have throw rugs and other things on the floor that can make you trip. What can I do in the kitchen? Clean up any spills right away. Avoid walking on wet floors. Keep items that you use a lot in easy-to-reach places. If you need to reach something above you, use a strong step stool that has a grab bar. Keep electrical cords out of the way. Do not use floor polish or wax that makes floors slippery. If you must use wax, use non-skid floor wax. Do not have throw rugs and other things on the floor that can make you trip. What can I do with  my stairs? Do not leave any items on the stairs. Make sure that there are handrails on both sides of the stairs and use them. Fix handrails that are broken or loose. Make sure that handrails are as long as the stairways. Check any carpeting to make sure that it is firmly attached to the stairs. Fix any carpet that is loose or worn. Avoid having throw rugs at the top or bottom of the stairs. If you do have throw rugs, attach them to the floor with carpet tape. Make sure that you have a light switch at the top of the stairs and the bottom of the stairs. If you do not have them, ask someone to add them for you. What else can I do to help prevent falls? Wear shoes that: Do not have high  heels. Have rubber bottoms. Are comfortable and fit you well. Are closed at the toe. Do not wear sandals. If you use a stepladder: Make sure that it is fully opened. Do not climb a closed stepladder. Make sure that both sides of the stepladder are locked into place. Ask someone to hold it for you, if possible. Clearly mark and make sure that you can see: Any grab bars or handrails. First and last steps. Where the edge of each step is. Use tools that help you move around (mobility aids) if they are needed. These include: Canes. Walkers. Scooters. Crutches. Turn on the lights when you go into a dark area. Replace any light bulbs as soon as they burn out. Set up your furniture so you have a clear path. Avoid moving your furniture around. If any of your floors are uneven, fix them. If there are any pets around you, be aware of where they are. Review your medicines with your doctor. Some medicines can make you feel dizzy. This can increase your chance of falling. Ask your doctor what other things that you can do to help prevent falls. This information is not intended to replace advice given to you by your health care provider. Make sure you discuss any questions you have with your health care provider. Document Released: 10/19/2008 Document Revised: 05/31/2015 Document Reviewed: 01/27/2014 Elsevier Interactive Patient Education  2017 [employer].

## 2020-10-18 NOTE — Progress Notes (Signed)
Subjective:   Sheila Ferrell is a 80 y.o. female who presents for Medicare Annual (Subsequent) preventive examination.  Virtual Visit via Telephone Note  I connected with  Sheila Ferrell on 10/18/20 at  2:45 PM EDT by telephone and verified that I am speaking with the correct person using two identifiers.  Location: Patient: Home Provider: [facility] Persons participating in the virtual visit: patient/Nurse Health Advisor   I discussed the limitations, risks, security and privacy concerns of performing an evaluation and management service by telephone and the availability of in person appointments. The patient expressed understanding and agreed to proceed.  Interactive audio and video telecommunications were attempted between this nurse and patient, however failed, due to patient having technical difficulties OR patient did not have access to video capability.  We continued and completed visit with audio only.  Some vital signs may be absent or patient reported.   Sheila Avans E Jennifermarie Franzen, LPN   Review of Systems     Cardiac Risk Factors include: advanced age (>97men, >45 women);obesity (BMI >30kg/m2);sedentary lifestyle;dyslipidemia;hypertension     Objective:    Today's Vitals   10/18/20 1418  Weight: 180 lb (81.6 kg)  Height: 5\' 1"  (1.549 m)   Body mass index is 34.01 kg/m.  Advanced Directives 10/18/2020 10/18/2019 09/14/2018 09/10/2017 06/13/2016 02/15/2014  Does Patient Have a Medical Advance Directive? Yes Yes Yes Yes Yes Yes  Type of Paramedic of [place];Living will Living will [place];Living will [place];Living will [place];Living will Living will  Does patient want to make changes to medical advance directive? - No - Patient declined No - Patient declined No - Patient declined Yes (MAU/Ambulatory/Procedural Areas - Information given) -  Copy of [place] in Chart? No - copy  requested - No - copy requested No - copy requested No - copy requested -    Current Medications (verified) Outpatient Encounter Medications as of 10/18/2020  Medication Sig   ascorbic acid (VITAMIN C) 1000 MG tablet Take 1,000 mg by mouth daily.   atorvastatin (LIPITOR) 20 MG tablet Take 1 tablet (20 mg total) by mouth daily.   calcium-vitamin D 250-100 MG-UNIT tablet Take by mouth.   Cholecalciferol (VITAMIN D-3) 1000 UNITS CAPS Take by mouth daily.   levothyroxine (LEVOXYL) 112 MCG tablet Take 1 tablet (112 mcg total) by mouth daily before breakfast.   lisinopril (ZESTRIL) 20 MG tablet Take 1 tablet (20 mg total) by mouth daily.   Omega-3 Fatty Acids (FISH OIL) 1200 MG CPDR Take by mouth.   sertraline (ZOLOFT) 100 MG tablet Take 1.5 tablets (150 mg total) by mouth daily.   [DISCONTINUED] ALPRAZolam (XANAX) 0.25 MG tablet Take 1 tablet (0.25 mg total) by mouth 2 (two) times daily as needed for anxiety.   [DISCONTINUED] ascorbic acid (VITAMIN C) 100 MG tablet Take by mouth.   [DISCONTINUED] sertraline (ZOLOFT) 100 MG tablet Take by mouth.   No facility-administered encounter medications on file as of 10/18/2020.    Allergies (verified) Patient has no known allergies.   History: Past Medical History:  Diagnosis Date   Allergic rhinitis    Anxiety    Depression    Hyperlipidemia    Hypertension    Hypothyroidism 12/07/1995   Impacted cerumen of left ear    Insomnia    Osteoporosis    Postmenopausal 01/06/1997   Thyromegaly 01/06/1998   Past Surgical History:  Procedure Laterality Date   EYE SURGERY Bilateral 2017  had cataract surgery in both eyes in the same year   [place]   BILATERAL     Family History  Problem Relation Age of Onset   Melanoma Brother    Deep vein thrombosis Brother    Hypertension Mother    Stroke Mother    Diabetes Paternal Grandfather    Cancer Father         leukemia   Healthy Daughter    Healthy Son    Heart disease Brother    Healthy Daughter    Hypothyroidism Daughter    Social History   Socioeconomic History   Marital status: Widowed    Spouse name: Sheila Masters (Gene)   Number of children: 4   Years of education: 13   Highest education level: Some college, no degree  Occupational History   Occupation: retired  Tobacco Use   Smoking status: Former    Packs/day: 1.00    Years: 20.00    Pack years: 20.00    Types: Cigarettes    Quit date: 01/07/1992    Years since quitting: 28.8   Smokeless tobacco: Never  Vaping Use   Vaping Use: Never used  Substance and Sexual Activity   Alcohol use: No   Drug use: No   Sexual activity: Not Currently  Other Topics Concern   Not on file  Social History Narrative   Lives alone. Has stairs, but doesn't have to use them daily.   Her daughter lives next door.   Social Determinants of Health   Financial Resource Strain: Low Risk    Difficulty of Paying Living Expenses: Not hard at all  Food Insecurity: No Food Insecurity   Worried About Charity fundraiser in the Last Year: Never true   [place] in the Last Year: Never true  Transportation Needs: No Transportation Needs   Lack of Transportation (Medical): No   Lack of Transportation (Non-Medical): No  Physical Activity: Inactive   Days of Exercise per Week: 0 days   Minutes of Exercise per Session: 0 min  Stress: Stress Concern Present   Feeling of Stress : To some extent  Social Connections: Socially Isolated   Frequency of Communication with Friends and Family: More than three times a week   Frequency of Social Gatherings with Friends and Family: Once a week   Attends Religious Services: Never   Marine scientist or Organizations: No   Attends Archivist Meetings: Never   Marital Status: Widowed    Tobacco Counseling Counseling given: Not Answered   Clinical Intake:  Pre-visit preparation completed:  Yes  Pain : No/denies pain     BMI - recorded: 34.01 Nutritional Status: BMI > 30  Obese Nutritional Risks: None Diabetes: No  How often do you need to have someone help you when you read instructions, pamphlets, or other written materials from your doctor or pharmacy?: 1 - Never  Diabetic? No  Interpreter Needed?: No  Information entered by :: Sheila Bickle, LPN   Activities of Daily Living In your present state of health, do you have any difficulty performing the following activities: 10/18/2020  Hearing? N  Vision? N  Difficulty concentrating or making decisions? N  Walking or climbing stairs? N  Dressing or bathing? N  Doing errands, shopping? N  Preparing Food and eating ?  N  Using the Toilet? N  In the past six months, have you accidently leaked urine? Y  Do you have problems with loss of bowel control? N  Managing your Medications? N  Managing your Finances? N  Housekeeping or managing your Housekeeping? N  Some recent data might be hidden    Patient Care Team: [name], FNP as PCP - General (Nurse Practitioner) Melina Schools, OD (Optometry) Shea Evans Norva Riffle, LCSW as Social Worker (Licensed Clinical Social Worker) Ilean China, RN as Registered Nurse  Indicate any recent [place] you may have received from other than [facility] providers in the past year (date may be approximate).     Assessment:   This is a routine wellness examination for Sheila Ferrell.  Hearing/Vision screen Hearing Screening - Comments:: Denies hearing difficulties  Vision Screening - Comments:: Wears rx glasses - up to date with annual eye exams with [facility]  Dietary issues and exercise activities discussed: Current Exercise Habits: The patient does not participate in regular exercise at present, Exercise limited by: None identified   Goals Addressed             This Visit's Progress    DIET - INCREASE WATER INTAKE   Not on track    Exercise 150 minutes  per week (moderate activity)   Not on track    Walk for 30 minutes daily       Depression Screen PHQ 2/9 Scores 10/18/2020 06/11/2020 12/12/2019 06/09/2019 12/08/2018 09/14/2018 06/09/2018  PHQ - 2 Score 2 2 6  0 0 3 3  PHQ- 9 Score 9 9 16  - - 8 14    Fall Risk Fall Risk  10/18/2020 06/11/2020 12/12/2019 10/18/2019 06/09/2019  Falls in the past year? 0 0 0 0 0  Number falls in past yr: 0 - - - -  Injury with Fall? 0 - - - -  Risk for fall due to : No Fall Risks - - - -  Follow up Falls prevention discussed - - - -  Comment - - - - -    FALL RISK PREVENTION PERTAINING TO THE HOME:  Any stairs in or around the home? Yes  If so, are there any without handrails? No  Home free of loose throw rugs in walkways, pet beds, electrical cords, etc? Yes  Adequate lighting in your home to reduce risk of falls? Yes   ASSISTIVE DEVICES UTILIZED TO PREVENT FALLS:  Life alert? No  Use of a cane, walker or w/c? No  Grab bars in the bathroom? Yes  Shower chair or bench in shower? Yes  Elevated toilet seat or a handicapped toilet? Yes   TIMED UP AND GO:  Was the test performed? No . Telephonic visit  Cognitive Function: Normal cognitive status assessed by direct observation by this Nurse Health Advisor. No abnormalities found.   MMSE - Mini Mental State Exam 09/10/2017 10/06/2016 06/13/2016  Orientation to time 5 5 5   Orientation to Place 5 5 5   Registration 3 3 3   Attention/ Calculation 5 5 3   Recall 2 3 3   Language- name 2 objects 2 2 2   Language- repeat 1 1 1   Language- follow 3 step command 3 3 3   Language- read & follow direction 1 1 1   Write a sentence 1 1 1   Copy design 1 0 0  Total score 29 29 27      6CIT Screen 10/18/2019 09/14/2018  What Year? 0 points 0 points  What month? 0 points  0 points  What time? 0 points 0 points  Count back from 20 0 points 0 points  Months in reverse 0 points 0 points  Repeat phrase 0 points 0 points  Total Score 0 0    Immunizations Immunization History   Administered Date(s) Administered   Influenza, High Dose Seasonal PF 10/15/2014, 10/16/2015, 10/11/2016, 10/30/2017, 10/15/2019   Influenza, Quadrivalent, Recombinant, Inj, Pf 10/22/2018   Influenza,inj,Quad PF,6+ Mos 10/12/2013   Influenza-Unspecified 10/13/2020   Moderna Sars-Covid-2 Vaccination 02/02/2019, 03/02/2019   Pneumococcal Conjugate-13 01/10/2013   Pneumococcal Polysaccharide-23 05/18/2014   Tdap 04/11/2013   Zoster Recombinat (Shingrix) 06/23/2020, 08/24/2020    TDAP status: Up to date  Flu Vaccine status: Up to date  Pneumococcal vaccine status: Up to date  Covid-19 vaccine status: Completed vaccines  Qualifies for Shingles Vaccine? Yes   Zostavax completed Yes   Shingrix Completed?: Yes  Screening Tests Health Maintenance  Topic Date Due   COVID-19 Vaccine (3 - Booster for Moderna series) 11/03/2020 (Originally 07/30/2019)   MAMMOGRAM  12/13/2020   TETANUS/TDAP  04/12/2023   INFLUENZA VACCINE  Completed   DEXA SCAN  Completed   Zoster Vaccines- Shingrix  Completed   HPV VACCINES  Aged Out    Health Maintenance  There are no preventive care reminders to display for this patient.   Colorectal cancer screening: No longer required.   Mammogram status: Completed 12/14/2019. Repeat every year Made appt with [facility] 01/02/21  Bone Density status: Completed 12/12/2019. Results reflect: Bone density results: OSTEOPENIA. Repeat every 2 years.  Lung Cancer Screening: (Low Dose CT Chest recommended if Age 30-80 years, 30 pack-year currently smoking OR have quit w/in 15years.) does not qualify.   Additional Screening:  Hepatitis C Screening: does not qualify  Vision Screening: Recommended annual ophthalmology exams for early detection of glaucoma and other disorders of the eye. Is the patient up to date with their annual eye exam?  Yes  Who is the provider or what is the name of the office in which the patient attends annual eye exams? [facility] If  pt is not established with a provider, would they like to be referred to a provider to establish care? No .   Dental Screening: Recommended annual dental exams for proper oral hygiene  Community Resource Referral / Chronic Care Management: CRR required this visit?  No   CCM required this visit?  No      Plan:     I have personally reviewed and noted the following in the patient's chart:   Medical and social history Use of alcohol, tobacco or illicit drugs  Current medications and supplements including opioid prescriptions.  Functional ability and status Nutritional status Physical activity Advanced directives List of other physicians Hospitalizations, surgeries, and ER visits in previous 12 months Vitals Screenings to include cognitive, depression, and falls Referrals and appointments  In addition, I have reviewed and discussed with patient certain preventive protocols, quality metrics, and best practice recommendations. A written personalized care plan for preventive services as well as general preventive health recommendations were provided to patient.     Sandrea Hammond, LPN   [id number]   Nurse Notes: depression/ lack of motivation since husband passed away, declines assistance, counseling, therapy, etc.

## 2020-10-23 ENCOUNTER — Other Ambulatory Visit: Payer: Self-pay | Admitting: Nurse Practitioner

## 2020-10-23 DIAGNOSIS — Z1231 Encounter for screening mammogram for malignant neoplasm of breast: Secondary | ICD-10-CM

## 2020-11-15 DIAGNOSIS — M75102 Unspecified rotator cuff tear or rupture of left shoulder, not specified as traumatic: Secondary | ICD-10-CM | POA: Diagnosis not present

## 2020-12-05 DIAGNOSIS — M24112 Other articular cartilage disorders, left shoulder: Secondary | ICD-10-CM | POA: Diagnosis not present

## 2020-12-05 DIAGNOSIS — M75112 Incomplete rotator cuff tear or rupture of left shoulder, not specified as traumatic: Secondary | ICD-10-CM | POA: Diagnosis not present

## 2020-12-05 DIAGNOSIS — I10 Essential (primary) hypertension: Secondary | ICD-10-CM | POA: Diagnosis not present

## 2020-12-05 DIAGNOSIS — M7542 Impingement syndrome of left shoulder: Secondary | ICD-10-CM | POA: Diagnosis not present

## 2020-12-05 DIAGNOSIS — Z79899 Other long term (current) drug therapy: Secondary | ICD-10-CM | POA: Diagnosis not present

## 2020-12-05 DIAGNOSIS — Z87891 Personal history of nicotine dependence: Secondary | ICD-10-CM | POA: Diagnosis not present

## 2020-12-05 DIAGNOSIS — E039 Hypothyroidism, unspecified: Secondary | ICD-10-CM | POA: Diagnosis not present

## 2020-12-05 DIAGNOSIS — M75122 Complete rotator cuff tear or rupture of left shoulder, not specified as traumatic: Secondary | ICD-10-CM | POA: Diagnosis not present

## 2020-12-05 DIAGNOSIS — G8918 Other acute postprocedural pain: Secondary | ICD-10-CM | POA: Diagnosis not present

## 2020-12-05 DIAGNOSIS — F419 Anxiety disorder, unspecified: Secondary | ICD-10-CM | POA: Diagnosis not present

## 2020-12-05 DIAGNOSIS — M19011 Primary osteoarthritis, right shoulder: Secondary | ICD-10-CM | POA: Diagnosis not present

## 2020-12-05 DIAGNOSIS — F32A Depression, unspecified: Secondary | ICD-10-CM | POA: Diagnosis not present

## 2020-12-05 DIAGNOSIS — Z79890 Hormone replacement therapy (postmenopausal): Secondary | ICD-10-CM | POA: Diagnosis not present

## 2020-12-05 DIAGNOSIS — M19012 Primary osteoarthritis, left shoulder: Secondary | ICD-10-CM | POA: Diagnosis not present

## 2020-12-05 DIAGNOSIS — S43432A Superior glenoid labrum lesion of left shoulder, initial encounter: Secondary | ICD-10-CM | POA: Diagnosis not present

## 2020-12-05 DIAGNOSIS — E669 Obesity, unspecified: Secondary | ICD-10-CM | POA: Diagnosis not present

## 2020-12-11 ENCOUNTER — Ambulatory Visit: Payer: Self-pay | Admitting: Nurse Practitioner

## 2020-12-13 DIAGNOSIS — M25512 Pain in left shoulder: Secondary | ICD-10-CM | POA: Diagnosis not present

## 2020-12-19 ENCOUNTER — Other Ambulatory Visit: Payer: Self-pay | Admitting: Nurse Practitioner

## 2020-12-19 DIAGNOSIS — I10 Essential (primary) hypertension: Secondary | ICD-10-CM

## 2020-12-19 NOTE — Telephone Encounter (Signed)
Scheduled for 12/21/2020 12:30 MMM

## 2020-12-19 NOTE — Telephone Encounter (Signed)
Pt needs appt with PCP for labs and chronic follow up.  90 day supply request denied to mail in pharmacy.  Please schedule pt appt and then can send in enough to get through to appt.

## 2020-12-21 ENCOUNTER — Ambulatory Visit: Payer: Medicare PPO | Admitting: Nurse Practitioner

## 2020-12-24 ENCOUNTER — Ambulatory Visit (INDEPENDENT_AMBULATORY_CARE_PROVIDER_SITE_OTHER): Payer: Medicare PPO | Admitting: Nurse Practitioner

## 2020-12-24 ENCOUNTER — Encounter: Payer: Self-pay | Admitting: Nurse Practitioner

## 2020-12-24 VITALS — BP 139/74 | HR 90 | Temp 96.8°F | Resp 20 | Ht 61.0 in | Wt 187.0 lb

## 2020-12-24 DIAGNOSIS — E782 Mixed hyperlipidemia: Secondary | ICD-10-CM | POA: Diagnosis not present

## 2020-12-24 DIAGNOSIS — I10 Essential (primary) hypertension: Secondary | ICD-10-CM | POA: Diagnosis not present

## 2020-12-24 DIAGNOSIS — F3342 Major depressive disorder, recurrent, in full remission: Secondary | ICD-10-CM | POA: Diagnosis not present

## 2020-12-24 DIAGNOSIS — M810 Age-related osteoporosis without current pathological fracture: Secondary | ICD-10-CM

## 2020-12-24 DIAGNOSIS — E559 Vitamin D deficiency, unspecified: Secondary | ICD-10-CM

## 2020-12-24 DIAGNOSIS — E034 Atrophy of thyroid (acquired): Secondary | ICD-10-CM | POA: Diagnosis not present

## 2020-12-24 DIAGNOSIS — Z6833 Body mass index (BMI) 33.0-33.9, adult: Secondary | ICD-10-CM | POA: Diagnosis not present

## 2020-12-24 DIAGNOSIS — R609 Edema, unspecified: Secondary | ICD-10-CM

## 2020-12-24 DIAGNOSIS — F411 Generalized anxiety disorder: Secondary | ICD-10-CM | POA: Diagnosis not present

## 2020-12-24 DIAGNOSIS — R600 Localized edema: Secondary | ICD-10-CM | POA: Insufficient documentation

## 2020-12-24 MED ORDER — ATORVASTATIN CALCIUM 20 MG PO TABS: 20.0000 mg | ORAL_TABLET | Freq: Every day | ORAL | 1 refills | Status: DC

## 2020-12-24 MED ORDER — LEVOTHYROXINE SODIUM 112 MCG PO TABS: 112.0000 ug | ORAL_TABLET | Freq: Every day | ORAL | 1 refills | Status: DC

## 2020-12-24 MED ORDER — LISINOPRIL 20 MG PO TABS: 20.0000 mg | ORAL_TABLET | Freq: Every day | ORAL | 1 refills | Status: DC

## 2020-12-24 MED ORDER — FUROSEMIDE 20 MG PO TABS: 20.0000 mg | ORAL_TABLET | Freq: Every day | ORAL | 3 refills | Status: DC

## 2020-12-24 MED ORDER — SERTRALINE HCL 100 MG PO TABS: 150.0000 mg | ORAL_TABLET | Freq: Every day | ORAL | 1 refills | Status: DC

## 2020-12-24 NOTE — Progress Notes (Addendum)
Subjective:    Patient ID: Sheila Ferrell, female    DOB: [birth year], 80 y.o.   MRN: [id number]   Chief Complaint: medical management of chronic issues     HPI:  1. Primary hypertension No c/o chest pain, sob or headache. Does not check blood pressure at home. BP Readings from Last 3 Encounters:  06/11/20 119/75  12/12/19 131/75  06/09/19 121/64     2. Mixed hyperlipidemia Does not watch diet very closely. Does no dedicated exercise. Lab Results  Component Value Date   CHOL 166 06/11/2020   HDL 43 06/11/2020   LDLCALC 94 06/11/2020   TRIG 165 (H) 06/11/2020   CHOLHDL 3.9 06/11/2020     3. Hypothyroidism due to acquired atrophy of thyroid No problems that aware of. Lab Results  Component Value Date   TSH 1.700 12/12/2019     4. GAD (generalized anxiety disorder) Zoloft daily helps control anxiety GAD 7 : Generalized Anxiety Score 12/24/2020 06/11/2020 12/12/2019 06/09/2019  Nervous, Anxious, on Edge 1 0 1 0  Control/stop worrying 1 0 2 1  Worry too much - different things 1 0 1 1  Trouble relaxing _0 Restless 1 0 1 3  Easily annoyed or irritable 1 0 0 0  Afraid - awful might happen 1 0 2 1  Total GAD 7 Score _1 Anxiety Difficulty Somewhat difficult Not difficult at all Somewhat difficult Somewhat difficult      5. Recurrent major depressive disorder, in full remission ([place]) Is on zoloft and is doing well. Depression screen [facility] 2/9 12/24/2020 10/18/2020 06/11/2020 12/12/2019 06/09/2019  Decreased Interest _2 0  Down, Depressed, Hopeless _3 0  PHQ - 2 Score _4 0  Altered sleeping 0 _5 -  Tired, decreased energy _6 -  Change in appetite 0 _7 -  Feeling bad or failure about yourself  0 1 1 0 -  Trouble concentrating _8 -  Moving slowly or fidgety/restless 0 0 0 0 -  Suicidal thoughts 0 0 0 0 -  PHQ-9 Score _9 -  Difficult doing work/chores Somewhat difficult Somewhat difficult Not difficult at all Not difficult  at all -  Some recent data might be hidden     6. Vitamin D deficiency Is on daily vitamin d supplement  7. BMI 33.0-33.9,adult Weight is up 7lbs BMI Readings from Last 3 Encounters:  12/24/20 35.33 kg/m  10/18/20 34.01 kg/m  06/11/20 35.33 kg/m   Wt Readings from Last 3 Encounters:  12/24/20 187 lb (84.8 kg)  10/18/20 180 lb (81.6 kg)  06/11/20 187 lb (84.8 kg)     8. Age-related osteoporosis without current pathological fracture Weight bearing exercise    Outpatient Encounter Medications as of 12/24/2020  Medication Sig   ascorbic acid (VITAMIN C) 1000 MG tablet Take 1,000 mg by mouth daily.   atorvastatin (LIPITOR) 20 MG tablet Take 1 tablet (20 mg total) by mouth daily.   calcium-vitamin D 250-100 MG-UNIT tablet Take by mouth.   Cholecalciferol (VITAMIN D-3) 1000 UNITS CAPS Take by mouth daily.   levothyroxine (LEVOXYL) 112 MCG tablet Take 1 tablet (112 mcg total) by mouth daily before breakfast.   lisinopril (ZESTRIL) 20 MG tablet Take 1 tablet (20 mg total) by mouth daily.   Omega-3 Fatty Acids (FISH OIL) 1200 MG CPDR Take by mouth.  sertraline (ZOLOFT) 100 MG tablet Take 1.5 tablets (150 mg total) by mouth daily.   No facility-administered encounter medications on file as of 12/24/2020.    Past Surgical History:  Procedure Laterality Date   EYE SURGERY Bilateral 2017   had cataract surgery in both eyes in the same year   [place]   BILATERAL      Family History  Problem Relation Age of Onset   Melanoma Brother    Deep vein thrombosis Brother    Hypertension Mother    Stroke Mother    Diabetes Paternal Grandfather    Cancer Father        leukemia   Healthy Daughter    Healthy Son    Heart disease Brother    Healthy Daughter    Hypothyroidism Daughter     New complaints: Patient had surgery on rotator cuff  on left shoulder  Social history: Lives by  herself. Family check son her dialy  Controlled substance contract: n/a     Review of Systems  Constitutional:  Negative for diaphoresis.  Eyes:  Negative for pain.  Respiratory:  Negative for shortness of breath.   Cardiovascular:  Negative for chest pain, palpitations and leg swelling.  Gastrointestinal:  Negative for abdominal pain.  Endocrine: Negative for polydipsia.  Skin:  Negative for rash.  Neurological:  Negative for dizziness, weakness and headaches.  Hematological:  Does not bruise/bleed easily.  All other systems reviewed and are negative.     Objective:   Physical Exam Vitals and nursing note reviewed.  Constitutional:      General: She is not in acute distress.    Appearance: Normal appearance. She is well-developed.  HENT:     Head: Normocephalic.     Right Ear: Tympanic membrane normal.     Left Ear: Tympanic membrane normal.     Nose: Nose normal.     Mouth/Throat:     Mouth: Mucous membranes are moist.  Eyes:     Pupils: Pupils are equal, round, and reactive to light.  Neck:     Vascular: No carotid bruit or JVD.  Cardiovascular:     Rate and Rhythm: Normal rate and regular rhythm.     Heart sounds: Normal heart sounds.  Pulmonary:     Effort: Pulmonary effort is normal. No respiratory distress.     Breath sounds: Normal breath sounds. No wheezing or rales.  Chest:     Chest wall: No tenderness.  Abdominal:     General: Bowel sounds are normal. There is no distension or abdominal bruit.     Palpations: Abdomen is soft. There is no hepatomegaly, splenomegaly, mass or pulsatile mass.     Tenderness: There is no abdominal tenderness.  Musculoskeletal:        General: Normal range of motion.     Cervical back: Normal range of motion and neck supple.     Right lower leg: Edema (2+) present.     Left lower leg: Edema (2+) present.     Comments: Shoulder immobilizer to left shoudler  Lymphadenopathy:     Cervical: No cervical adenopathy.  Skin:     General: Skin is warm and dry.  Neurological:     Mental Status: She is alert and oriented to person, place, and time.     Deep Tendon Reflexes:  Reflexes are normal and symmetric.  Psychiatric:        Behavior: Behavior normal.        Thought Content: Thought content normal.        Judgment: Judgment normal.    BP 139/74    Pulse 90    Temp (!) 96.8 F (36 C) (Temporal)    Resp 20    Ht _0  (1.549 m)    Wt 187 lb (84.8 kg)    SpO2 98%    BMI 35.33 kg/m        Assessment & Plan:   JAXIE RACANELLI comes in today with chief complaint of Medical Management of Chronic Issues   Diagnosis and orders addressed:  1. Primary hypertension Low sodium diet - lisinopril (ZESTRIL) 20 MG tablet; Take 1 tablet (20 mg total) by mouth daily.  Dispense: 90 tablet; Refill: 1 - CBC with Differential/Platelet - CMP14+EGFR  2. Mixed hyperlipidemia Low fat diet - atorvastatin (LIPITOR) 20 MG tablet; Take 1 tablet (20 mg total) by mouth daily.  Dispense: 90 tablet; Refill: 1 - Lipid panel  3. Hypothyroidism due to acquired atrophy of thyroid Labs pending - levothyroxine (LEVOXYL) 112 MCG tablet; Take 1 tablet (112 mcg total) by mouth daily before breakfast.  Dispense: 90 tablet; Refill: 1 - Thyroid Panel With TSH  4. GAD (generalized anxiety disorder) Stress manaegment  5. Recurrent major depressive disorder, in full remission [facility]) Stress management  6. Vitamin D deficiency Continue vitamin d sup[plement  7. BMI 33.0-33.9,adult Discussed diet and exercise for person with BMI >25 Will recheck weight in 3-6 months   8. Age-related osteoporosis without current pathological fracture Weight bearing exercise  9. Peripheral edema Lasx 3m prn  Labs pending Health Maintenance reviewed Diet and exercise encouraged  Follow up plan: 6 months   Mary-Margaret MHassell Done FNP

## 2020-12-24 NOTE — Patient Instructions (Signed)

## 2020-12-25 LAB — THYROID PANEL WITH TSH
Free Thyroxine Index: 2.2 (ref 1.2–4.9)
T3 Uptake Ratio: 29 % (ref 24–39)
T4, Total: 7.6 ug/dL (ref 4.5–12.0)
TSH: 3.12 u[IU]/mL (ref 0.450–4.500)

## 2020-12-25 LAB — CBC WITH DIFFERENTIAL/PLATELET
Basophils Absolute: 0.1 10*3/uL (ref 0.0–0.2)
Basos: 1 %
EOS (ABSOLUTE): 0.4 10*3/uL (ref 0.0–0.4)
Eos: 6 %
Hematocrit: 37.5 % (ref 34.0–46.6)
Hemoglobin: 12.5 g/dL (ref 11.1–15.9)
Immature Grans (Abs): 0 10*3/uL (ref 0.0–0.1)
Immature Granulocytes: 0 %
Lymphocytes Absolute: 1.8 10*3/uL (ref 0.7–3.1)
Lymphs: 24 %
MCH: 31.3 pg (ref 26.6–33.0)
MCHC: 33.3 g/dL (ref 31.5–35.7)
MCV: 94 fL (ref 79–97)
Monocytes Absolute: 0.5 10*3/uL (ref 0.1–0.9)
Monocytes: 7 %
Neutrophils Absolute: 4.7 10*3/uL (ref 1.4–7.0)
Neutrophils: 62 %
Platelets: 329 10*3/uL (ref 150–450)
RBC: 3.99 x10E6/uL (ref 3.77–5.28)
RDW: 12.6 % (ref 11.7–15.4)
WBC: 7.4 10*3/uL (ref 3.4–10.8)

## 2020-12-25 LAB — LIPID PANEL
Chol/HDL Ratio: 4.1 ratio (ref 0.0–4.4)
Cholesterol, Total: 195 mg/dL (ref 100–199)
HDL: 48 mg/dL (ref >39)
LDL Chol Calc (NIH): 117 mg/dL — ABNORMAL HIGH (ref 0–99)
Triglycerides: 168 mg/dL — ABNORMAL HIGH (ref 0–149)
VLDL Cholesterol Cal: 30 mg/dL (ref 5–40)

## 2020-12-25 LAB — CMP14+EGFR
ALT: 17 IU/L (ref 0–32)
AST: 14 IU/L (ref 0–40)
Albumin/Globulin Ratio: 1.7 (ref 1.2–2.2)
Albumin: 4.5 g/dL (ref 3.7–4.7)
Alkaline Phosphatase: 115 IU/L (ref 44–121)
BUN/Creatinine Ratio: 18 (ref 12–28)
BUN: 16 mg/dL (ref 8–27)
Bilirubin Total: 0.2 mg/dL (ref 0.0–1.2)
CO2: 25 mmol/L (ref 20–29)
Calcium: 9.5 mg/dL (ref 8.7–10.3)
Chloride: 103 mmol/L (ref 96–106)
Creatinine, Ser: 0.88 mg/dL (ref 0.57–1.00)
Globulin, Total: 2.6 g/dL (ref 1.5–4.5)
Glucose: 80 mg/dL (ref 70–99)
Potassium: 4.1 mmol/L (ref 3.5–5.2)
Sodium: 142 mmol/L (ref 134–144)
Total Protein: 7.1 g/dL (ref 6.0–8.5)
eGFR: 66 mL/min/{1.73_m2} (ref >59)

## 2021-01-01 DIAGNOSIS — M25512 Pain in left shoulder: Secondary | ICD-10-CM | POA: Diagnosis not present

## 2021-01-01 DIAGNOSIS — R29898 Other symptoms and signs involving the musculoskeletal system: Secondary | ICD-10-CM | POA: Diagnosis not present

## 2021-01-10 DIAGNOSIS — M75102 Unspecified rotator cuff tear or rupture of left shoulder, not specified as traumatic: Secondary | ICD-10-CM | POA: Diagnosis not present

## 2021-01-10 DIAGNOSIS — R29898 Other symptoms and signs involving the musculoskeletal system: Secondary | ICD-10-CM | POA: Diagnosis not present

## 2021-01-10 DIAGNOSIS — M25512 Pain in left shoulder: Secondary | ICD-10-CM | POA: Diagnosis not present

## 2021-01-16 DIAGNOSIS — M75102 Unspecified rotator cuff tear or rupture of left shoulder, not specified as traumatic: Secondary | ICD-10-CM | POA: Diagnosis not present

## 2021-01-16 DIAGNOSIS — M25512 Pain in left shoulder: Secondary | ICD-10-CM | POA: Diagnosis not present

## 2021-01-16 DIAGNOSIS — R29898 Other symptoms and signs involving the musculoskeletal system: Secondary | ICD-10-CM | POA: Diagnosis not present

## 2021-01-23 DIAGNOSIS — M25512 Pain in left shoulder: Secondary | ICD-10-CM | POA: Diagnosis not present

## 2021-01-23 DIAGNOSIS — M75102 Unspecified rotator cuff tear or rupture of left shoulder, not specified as traumatic: Secondary | ICD-10-CM | POA: Diagnosis not present

## 2021-01-23 DIAGNOSIS — R29898 Other symptoms and signs involving the musculoskeletal system: Secondary | ICD-10-CM | POA: Diagnosis not present

## 2021-01-30 DIAGNOSIS — R29898 Other symptoms and signs involving the musculoskeletal system: Secondary | ICD-10-CM | POA: Diagnosis not present

## 2021-01-30 DIAGNOSIS — M25512 Pain in left shoulder: Secondary | ICD-10-CM | POA: Diagnosis not present

## 2021-02-06 DIAGNOSIS — M25512 Pain in left shoulder: Secondary | ICD-10-CM | POA: Diagnosis not present

## 2021-02-06 DIAGNOSIS — R29898 Other symptoms and signs involving the musculoskeletal system: Secondary | ICD-10-CM | POA: Diagnosis not present

## 2021-02-13 DIAGNOSIS — M25512 Pain in left shoulder: Secondary | ICD-10-CM | POA: Diagnosis not present

## 2021-02-13 DIAGNOSIS — R29898 Other symptoms and signs involving the musculoskeletal system: Secondary | ICD-10-CM | POA: Diagnosis not present

## 2021-02-20 DIAGNOSIS — R29898 Other symptoms and signs involving the musculoskeletal system: Secondary | ICD-10-CM | POA: Diagnosis not present

## 2021-02-20 DIAGNOSIS — M25512 Pain in left shoulder: Secondary | ICD-10-CM | POA: Diagnosis not present

## 2021-02-28 DIAGNOSIS — R29898 Other symptoms and signs involving the musculoskeletal system: Secondary | ICD-10-CM | POA: Diagnosis not present

## 2021-02-28 DIAGNOSIS — M25512 Pain in left shoulder: Secondary | ICD-10-CM | POA: Diagnosis not present

## 2021-05-26 ENCOUNTER — Other Ambulatory Visit: Payer: Self-pay | Admitting: Nurse Practitioner

## 2021-05-26 DIAGNOSIS — R609 Edema, unspecified: Secondary | ICD-10-CM

## 2021-06-20 ENCOUNTER — Ambulatory Visit (INDEPENDENT_AMBULATORY_CARE_PROVIDER_SITE_OTHER): Payer: Medicare PPO | Admitting: Nurse Practitioner

## 2021-06-20 ENCOUNTER — Encounter: Payer: Self-pay | Admitting: Nurse Practitioner

## 2021-06-20 VITALS — BP 126/85 | HR 90 | Temp 97.5°F | Resp 20 | Ht 61.0 in | Wt 187.0 lb

## 2021-06-20 DIAGNOSIS — E782 Mixed hyperlipidemia: Secondary | ICD-10-CM | POA: Diagnosis not present

## 2021-06-20 DIAGNOSIS — Z6833 Body mass index (BMI) 33.0-33.9, adult: Secondary | ICD-10-CM | POA: Diagnosis not present

## 2021-06-20 DIAGNOSIS — F3342 Major depressive disorder, recurrent, in full remission: Secondary | ICD-10-CM | POA: Diagnosis not present

## 2021-06-20 DIAGNOSIS — R609 Edema, unspecified: Secondary | ICD-10-CM | POA: Diagnosis not present

## 2021-06-20 DIAGNOSIS — E559 Vitamin D deficiency, unspecified: Secondary | ICD-10-CM | POA: Diagnosis not present

## 2021-06-20 DIAGNOSIS — M810 Age-related osteoporosis without current pathological fracture: Secondary | ICD-10-CM | POA: Diagnosis not present

## 2021-06-20 DIAGNOSIS — E034 Atrophy of thyroid (acquired): Secondary | ICD-10-CM

## 2021-06-20 DIAGNOSIS — F411 Generalized anxiety disorder: Secondary | ICD-10-CM | POA: Diagnosis not present

## 2021-06-20 DIAGNOSIS — I10 Essential (primary) hypertension: Secondary | ICD-10-CM | POA: Diagnosis not present

## 2021-06-20 MED ORDER — ATORVASTATIN CALCIUM 20 MG PO TABS: 20.0000 mg | ORAL_TABLET | Freq: Every day | ORAL | 1 refills | Status: DC

## 2021-06-20 MED ORDER — FUROSEMIDE 20 MG PO TABS: 20.0000 mg | ORAL_TABLET | Freq: Every day | ORAL | 1 refills | Status: DC

## 2021-06-20 MED ORDER — SERTRALINE HCL 100 MG PO TABS: 150.0000 mg | ORAL_TABLET | Freq: Every day | ORAL | 1 refills | Status: DC

## 2021-06-20 MED ORDER — LISINOPRIL 20 MG PO TABS: 20.0000 mg | ORAL_TABLET | Freq: Every day | ORAL | 1 refills | Status: DC

## 2021-06-20 MED ORDER — LEVOTHYROXINE SODIUM 112 MCG PO TABS: 112.0000 ug | ORAL_TABLET | Freq: Every day | ORAL | 1 refills | Status: DC

## 2021-06-20 NOTE — Progress Notes (Signed)
Subjective:    Patient ID: Sheila Ferrell, female    DOB: Sep 29, 1940, 81 y.o.   MRN: [id number]   Chief Complaint: medical management of chronic issues     HPI:  Sheila Ferrell is a 81 y.o. who identifies as a female who was assigned female at birth.   Social history: Lives with: by herself- husband died 2 years ago Work history: retired   Scientist, forensic in today for follow up of the following chronic medical issues:  1. Primary hypertension No c/o chest pain, sob or headache does not check blood pressure at home. BP Readings from Last 3 Encounters:  12/24/20 139/74  06/11/20 119/75  12/12/19 131/75     2. Mixed hyperlipidemia Doe snot really watch diet and does no dedicated exercise. Lab Results  Component Value Date   CHOL 195 12/24/2020   HDL 48 12/24/2020   LDLCALC 117 (H) 12/24/2020   TRIG 168 (H) 12/24/2020   CHOLHDL 4.1 12/24/2020     3. Hypothyroidism due to acquired atrophy of thyroid No problems that she is aware of. Lab Results  Component Value Date   TSH 3.120 12/24/2020     4. Peripheral edema Is on lasix daly which helps. Has some edema by the end of each day.  5. Recurrent major depressive disorder, in full remission ([place]) 6. GAD (generalized anxiety disorder) Is on zoloft an dis doing well. No medication side effects.    12/24/2020    2:36 PM 06/11/2020   10:22 AM 12/12/2019   10:15 AM 06/09/2019   10:26 AM  GAD 7 : Generalized Anxiety Score  Nervous, Anxious, on Edge 1 0 1 0  Control/stop worrying 1 0 2 1  Worry too much - different things 1 0 1 1  Trouble relaxing '1 1 3 2  ' Restless 1 0 1 3  Easily annoyed or irritable 1 0 0 0  Afraid - awful might happen 1 0 2 1  Total GAD 7 Score '7 1 10 8  ' Anxiety Difficulty Somewhat difficult Not difficult at all Somewhat difficult Somewhat difficult       06/20/2021   11:59 AM 12/24/2020    2:35 PM 10/18/2020    2:22 PM  Depression screen PHQ 2/9  Decreased Interest '1 1 1  ' Down, Depressed,  Hopeless '1 1 1  ' PHQ - 2 Score '2 2 2  ' Altered sleeping 2 0 2  Tired, decreased energy '2 2 2  ' Change in appetite 2 0 1  Feeling bad or failure about yourself  1 0 1  Trouble concentrating 0 1 1  Moving slowly or fidgety/restless 0 0 0  Suicidal thoughts 0 0 0  PHQ-9 Score '9 5 9  ' Difficult doing work/chores Not difficult at all Somewhat difficult Somewhat difficult     7. Age-related osteoporosis without current pathological fracture Last dexascan was done on 12/12/19. T score was -2.2. sheis on daily vitamin d supplement. She does no weight baring exercises.  8. Vitamin D deficiency Is on daily vitamind supplement  9. BMI 33.0-33.9,adult No recent weight changes Wt Readings from Last 3 Encounters:  06/20/21 187 lb (84.8 kg)  12/24/20 187 lb (84.8 kg)  10/18/20 180 lb (81.6 kg)   BMI Readings from Last 3 Encounters:  06/20/21 35.33 kg/m  12/24/20 35.33 kg/m  10/18/20 34.01 kg/m      New complaints: None today  No Known Allergies Outpatient Encounter Medications as of 06/20/2021  Medication Sig   ascorbic acid (VITAMIN C)  1000 MG tablet Take 1,000 mg by mouth daily.   atorvastatin (LIPITOR) 20 MG tablet Take 1 tablet (20 mg total) by mouth daily.   calcium-vitamin D 250-100 MG-UNIT tablet Take by mouth.   Cholecalciferol (VITAMIN D-3) 1000 UNITS CAPS Take by mouth daily.   furosemide (LASIX) 20 MG tablet Take 1 tablet by mouth once daily   levothyroxine (LEVOXYL) 112 MCG tablet Take 1 tablet (112 mcg total) by mouth daily before breakfast.   lisinopril (ZESTRIL) 20 MG tablet Take 1 tablet (20 mg total) by mouth daily.   Omega-3 Fatty Acids (FISH OIL) 1200 MG CPDR Take by mouth.   sertraline (ZOLOFT) 100 MG tablet Take 1.5 tablets (150 mg total) by mouth daily.   No facility-administered encounter medications on file as of 06/20/2021.    Past Surgical History:  Procedure Laterality Date   EYE SURGERY Bilateral 2017   had cataract surgery in both eyes in the same  year   [place]     [id number]   TUBAL LIGATION  1977   BILATERAL      Family History  Problem Relation Age of Onset   Melanoma Brother    Deep vein thrombosis Brother    Hypertension Mother    Stroke Mother    Diabetes Paternal Grandfather    Cancer Father        leukemia   Healthy Daughter    Healthy Son    Heart disease Brother    Healthy Daughter    Hypothyroidism Daughter       Controlled substance contract: n/a     Review of Systems  Constitutional:  Negative for diaphoresis.  Eyes:  Negative for pain.  Respiratory:  Negative for shortness of breath.   Cardiovascular:  Negative for chest pain, palpitations and leg swelling.  Gastrointestinal:  Negative for abdominal pain.  Endocrine: Negative for polydipsia.  Skin:  Negative for rash.  Neurological:  Negative for dizziness, weakness and headaches.  Hematological:  Does not bruise/bleed easily.  All other systems reviewed and are negative.      Objective:   Physical Exam Vitals and nursing note reviewed.  Constitutional:      General: She is not in acute distress.    Appearance: Normal appearance. She is well-developed.  HENT:     Head: Normocephalic.     Right Ear: Tympanic membrane normal.     Left Ear: Tympanic membrane normal.     Nose: Nose normal.     Mouth/Throat:     Mouth: Mucous membranes are moist.  Eyes:     Pupils: Pupils are equal, round, and reactive to light.  Neck:     Vascular: No carotid bruit or JVD.  Cardiovascular:     Rate and Rhythm: Normal rate and regular rhythm.     Heart sounds: Normal heart sounds.  Pulmonary:     Effort: Pulmonary effort is normal. No respiratory distress.     Breath sounds: Normal breath sounds. No wheezing or rales.  Chest:     Chest wall: No tenderness.  Abdominal:     General: Bowel sounds are normal. There is no distension or abdominal bruit.     Palpations:  Abdomen is soft. There is no hepatomegaly, splenomegaly, mass or pulsatile mass.     Tenderness: There is no abdominal tenderness.  Musculoskeletal:  General: Normal range of motion.     Cervical back: Normal range of motion and neck supple.     Right lower leg: Edema (1+) present.     Left lower leg: Edema (1+) present.  Lymphadenopathy:     Cervical: No cervical adenopathy.  Skin:    General: Skin is warm and dry.  Neurological:     Mental Status: She is alert and oriented to person, place, and time.     Deep Tendon Reflexes: Reflexes are normal and symmetric.  Psychiatric:        Behavior: Behavior normal.        Thought Content: Thought content normal.        Judgment: Judgment normal.   BP 126/85   Pulse 90   Temp (!) 97.5 F (36.4 C) (Temporal)   Resp 20   Ht '5\' 1"'  (1.549 m)   Wt 187 lb (84.8 kg)   SpO2 95%   BMI 35.33 kg/m          Assessment & Plan:   AYARI LIWANAG comes in today with chief complaint of Medical Management of Chronic Issues   Diagnosis and orders addressed:  1. Primary hypertension Low sodium diet - lisinopril (ZESTRIL) 20 MG tablet; Take 1 tablet (20 mg total) by mouth daily.  Dispense: 90 tablet; Refill: 1 - CBC with Differential/Platelet - CMP14+EGFR  2. Mixed hyperlipidemia Low fat diet - atorvastatin (LIPITOR) 20 MG tablet; Take 1 tablet (20 mg total) by mouth daily.  Dispense: 90 tablet; Refill: 1 - Lipid panel  3. Hypothyroidism due to acquired atrophy of thyroid Labs oending - levothyroxine (LEVOXYL) 112 MCG tablet; Take 1 tablet (112 mcg total) by mouth daily before breakfast.  Dispense: 90 tablet; Refill: 1 - Thyroid Panel With TSH  4. Peripheral edema Elevate legs when sitting - furosemide (LASIX) 20 MG tablet; Take 1 tablet (20 mg total) by mouth daily.  Dispense: 90 tablet; Refill: 1  5. Recurrent major depressive disorder, in full remission ([place]) 6. GAD (generalized anxiety disorder) Stress management  7.  Age-related osteoporosis without current pathological fracture Weight bearing exercise  8. Vitamin D deficiency Continue daily vitamin d supplement  9. BMI 33.0-33.9,adult Discussed diet and exercise for person with BMI >25 Will recheck weight in 3-6 months    Labs pending Health Maintenance reviewed Diet and exercise encouraged  Follow up plan: 6 months   Mary-Margaret Hassell Done, FNP

## 2021-06-20 NOTE — Patient Instructions (Signed)
Peripheral Edema  Peripheral edema is swelling that is caused by a buildup of fluid. Peripheral edema most often affects the lower legs, ankles, and feet. It can also develop in the arms, hands, and face. The area of the body that has peripheral edema will look swollen. It may also feel heavy or warm. Your clothes may start to feel tight. Pressing on the area may make a temporary dent in your skin (pitting edema). You may not be able to move your swollen arm or leg as much as usual. There are many causes of peripheral edema. It can happen because of a complication of other conditions such as heart failure, kidney disease, or a problem with your circulation. It also can be a side effect of certain medicines or happen because of an infection. It often happens to women during pregnancy. Sometimes, the cause is not known. Follow these instructions at home: Managing pain, stiffness, and swelling  Raise (elevate) your legs while you are sitting or lying down. Move around often to prevent stiffness and to reduce swelling. Do not sit or stand for long periods of time. Do not wear tight clothing. Do not wear garters on your upper legs. Exercise your legs to get your circulation going. This helps to move the fluid back into your blood vessels, and it may help the swelling go down. Wear compression stockings as told by your health care provider. These stockings help to prevent blood clots and reduce swelling in your legs. It is important that these are the correct size. These stockings should be prescribed by your doctor to prevent possible injuries. If elastic bandages or wraps are recommended, use them as told by your health care provider. Medicines Take over-the-counter and prescription medicines only as told by your health care provider. Your health care provider may prescribe medicine to help your body get rid of excess water (diuretic). Take this medicine if you are told to take it. General  instructions Eat a low-salt (low-sodium) diet as told by your health care provider. Sometimes, eating less salt may reduce swelling. Pay attention to any changes in your symptoms. Moisturize your skin daily to help prevent skin from cracking and draining. Keep all follow-up visits. This is important. Contact a health care provider if: You have a fever. You have swelling in only one leg. You have increased swelling, redness, or pain in one or both of your legs. You have drainage or sores at the area where you have edema. Get help right away if: You have edema that starts suddenly or is getting worse, especially if you are pregnant or have a medical condition. You develop shortness of breath, especially when you are lying down. You have pain in your chest or abdomen. You feel weak. You feel like you will faint. These symptoms may be an emergency. Get help right away. Call 911. Do not wait to see if the symptoms will go away. Do not drive yourself to the hospital. Summary Peripheral edema is swelling that is caused by a buildup of fluid. Peripheral edema most often affects the lower legs, ankles, and feet. Move around often to prevent stiffness and to reduce swelling. Do not sit or stand for long periods of time. Pay attention to any changes in your symptoms. Contact a health care provider if you have edema that starts suddenly or is getting worse, especially if you are pregnant or have a medical condition. Get help right away if you develop shortness of breath, especially when lying down.   This information is not intended to replace advice given to you by your health care provider. Make sure you discuss any questions you have with your health care provider. Document Revised: 08/27/2020 Document Reviewed: 08/27/2020 Elsevier Patient Education  2023 [employer]  

## 2021-06-21 LAB — CBC WITH DIFFERENTIAL/PLATELET
Basophils Absolute: 0.1 10*3/uL (ref 0.0–0.2)
Basos: 1 %
EOS (ABSOLUTE): 0.5 10*3/uL — ABNORMAL HIGH (ref 0.0–0.4)
Eos: 6 %
Hematocrit: 40.1 % (ref 34.0–46.6)
Hemoglobin: 13 g/dL (ref 11.1–15.9)
Immature Grans (Abs): 0 10*3/uL (ref 0.0–0.1)
Immature Granulocytes: 0 %
Lymphocytes Absolute: 2.2 10*3/uL (ref 0.7–3.1)
Lymphs: 24 %
MCH: 30.9 pg (ref 26.6–33.0)
MCHC: 32.4 g/dL (ref 31.5–35.7)
MCV: 95 fL (ref 79–97)
Monocytes Absolute: 0.5 10*3/uL (ref 0.1–0.9)
Monocytes: 6 %
Neutrophils Absolute: 5.9 10*3/uL (ref 1.4–7.0)
Neutrophils: 63 %
Platelets: 328 10*3/uL (ref 150–450)
RBC: 4.21 x10E6/uL (ref 3.77–5.28)
RDW: 12.5 % (ref 11.7–15.4)
WBC: 9.4 10*3/uL (ref 3.4–10.8)

## 2021-06-21 LAB — CMP14+EGFR
ALT: 19 IU/L (ref 0–32)
AST: 18 IU/L (ref 0–40)
Albumin/Globulin Ratio: 1.8 (ref 1.2–2.2)
Albumin: 4.5 g/dL (ref 3.6–4.6)
Alkaline Phosphatase: 114 IU/L (ref 44–121)
BUN/Creatinine Ratio: 18 (ref 12–28)
BUN: 18 mg/dL (ref 8–27)
Bilirubin Total: 0.3 mg/dL (ref 0.0–1.2)
CO2: 28 mmol/L (ref 20–29)
Calcium: 9.9 mg/dL (ref 8.7–10.3)
Chloride: 98 mmol/L (ref 96–106)
Creatinine, Ser: 1.01 mg/dL — ABNORMAL HIGH (ref 0.57–1.00)
Globulin, Total: 2.5 g/dL (ref 1.5–4.5)
Glucose: 119 mg/dL — ABNORMAL HIGH (ref 70–99)
Potassium: 4.4 mmol/L (ref 3.5–5.2)
Sodium: 142 mmol/L (ref 134–144)
Total Protein: 7 g/dL (ref 6.0–8.5)
eGFR: 56 mL/min/{1.73_m2} — ABNORMAL LOW (ref >59)

## 2021-06-21 LAB — THYROID PANEL WITH TSH
Free Thyroxine Index: 2.4 (ref 1.2–4.9)
T3 Uptake Ratio: 28 % (ref 24–39)
T4, Total: 8.6 ug/dL (ref 4.5–12.0)
TSH: 3.35 u[IU]/mL (ref 0.450–4.500)

## 2021-06-21 LAB — LIPID PANEL
Chol/HDL Ratio: 3.4 ratio (ref 0.0–4.4)
Cholesterol, Total: 145 mg/dL (ref 100–199)
HDL: 43 mg/dL (ref >39)
LDL Chol Calc (NIH): 65 mg/dL (ref 0–99)
Triglycerides: 229 mg/dL — ABNORMAL HIGH (ref 0–149)
VLDL Cholesterol Cal: 37 mg/dL (ref 5–40)

## 2021-08-14 ENCOUNTER — Ambulatory Visit: Payer: Self-pay | Admitting: *Deleted

## 2021-08-14 NOTE — Chronic Care Management (AMB) (Signed)
  Chronic Care Management   Note  08/14/2021 Name: Sheila Ferrell MRN: [id number] DOB: 1940-09-14   Due to changes in the Chronic Care Management program, I am removing myself as the [place] from the Care Team and closing any RN Care Management Care Plans. The patient has not worked with the Consulting civil engineer within the past 6 months. Patient was not scheduled to be followed by the RN Care Coordination nurse for [facility].   Patient does not have an open Care Plan with another CCM team member. Patient does not have a current CCM referral placed since 05/06/21. CCM enrollment status changed to "not enrolled".   Patient's PCP can place a new referral if the they needs Care Management or Care Coordination services in the future.  Chong Sicilian, BSN, RN-BC Proofreader Dial: [phone]

## 2021-08-14 NOTE — Patient Instructions (Addendum)
Sheila Ferrell  At some point during the past 4 years, I have worked with you through the Chronic Care Management Program (CCM) at [facility]. We have not worked together within the past 6 months.   Due to program changes I am removing myself from your care team.   If you are currently active with another CCM Team Member, you will remain active with them unless they reach out to you with additional information.   If you feel that you need services in the future,  please talk with your primary care provider and request a new referral for Care Management or Care Coordination services. This does not affect your status as a patient at [facility].   Thank you for allowing me to participate in your your healthcare journey.  Chong Sicilian, BSN, RN-BC Proofreader Dial: [phone]

## 2021-08-20 ENCOUNTER — Ambulatory Visit (INDEPENDENT_AMBULATORY_CARE_PROVIDER_SITE_OTHER): Payer: Medicare PPO

## 2021-08-20 ENCOUNTER — Ambulatory Visit (INDEPENDENT_AMBULATORY_CARE_PROVIDER_SITE_OTHER): Payer: Medicare PPO | Admitting: Nurse Practitioner

## 2021-08-20 ENCOUNTER — Encounter: Payer: Self-pay | Admitting: Nurse Practitioner

## 2021-08-20 VITALS — BP 128/76 | HR 107 | Temp 97.7°F | Resp 20 | Ht 61.0 in | Wt 184.0 lb

## 2021-08-20 DIAGNOSIS — M79671 Pain in right foot: Secondary | ICD-10-CM | POA: Diagnosis not present

## 2021-08-20 DIAGNOSIS — M7731 Calcaneal spur, right foot: Secondary | ICD-10-CM

## 2021-08-20 MED ORDER — METHYLPREDNISOLONE ACETATE 40 MG/ML IJ SUSP
40.0000 mg | Freq: Once | INTRAMUSCULAR | Status: AC
  Administered 2021-08-20: 40 mg via INTRAMUSCULAR

## 2021-08-20 MED ORDER — BUPIVACAINE HCL 0.25 % IJ SOLN
1.0000 mL | Freq: Once | INTRAMUSCULAR | Status: AC
  Administered 2021-08-20: 1 mL via INTRA_ARTICULAR

## 2021-08-20 NOTE — Progress Notes (Signed)
   Subjective:    Patient ID: Sheila Ferrell, female    DOB: 1940-11-30, 81 y.o.   MRN: [id number]   Chief Complaint: Claudication (Right foot. Pain running up leg)   HPI Patient comes in today  c/o right foot pain. Pain started several weeks ago after walking a lot. Pain has increased and is in her heel and up her leg. Nothing makes pain better. Weight bearing increases pain. Rates pain 10/10 today. She has tried tylenol with no relief.     Review of Systems  Constitutional:  Negative for diaphoresis.  Eyes:  Negative for pain.  Respiratory:  Negative for shortness of breath.   Cardiovascular:  Negative for chest pain, palpitations and leg swelling.  Gastrointestinal:  Negative for abdominal pain.  Endocrine: Negative for polydipsia.  Musculoskeletal:  Positive for arthralgias (right heel).  Skin:  Negative for rash.  Neurological:  Negative for dizziness, weakness and headaches.  Hematological:  Does not bruise/bleed easily.  All other systems reviewed and are negative.      Objective:   Physical Exam Constitutional:      Appearance: Normal appearance. She is obese.  Cardiovascular:     Rate and Rhythm: Normal rate and regular rhythm.     Heart sounds: Normal heart sounds.  Pulmonary:     Effort: Pulmonary effort is normal.     Breath sounds: Normal breath sounds.  Musculoskeletal:     Comments: Pain on palpation of right heel Increase pain when walking to xray  Skin:    General: Skin is warm.  Neurological:     General: No focal deficit present.     Mental Status: She is alert and oriented to person, place, and time.  Psychiatric:        Mood and Affect: Mood normal.        Behavior: Behavior normal.    BP 128/76   Pulse (!) 107   Temp 97.7 F (36.5 C) (Temporal)   Resp 20   Ht '5\' 1"'$  (1.549 m)   Wt 184 lb (83.5 kg)   SpO2 93%   BMI 34.77 kg/m   Right foot xray- heel spur-Preliminary reading by Ronnald Collum, FNP  [facility]  Joint  Injection/Arthrocentesis  Date/Time: 08/20/2021 9:37 AM  Performed by: [name], FNP Authorized by: Hassell Done Mary-Margaret, FNP  Indications: pain  Location: right heel. Local anesthesia used: no  Anesthesia: Local anesthesia used: no  Sedation: Patient sedated: no  Needle size: 22 G Ultrasound guidance: no Methylprednisolone amount: 40 mg Bupivacaine 0.25% amount: 1 mL Patient tolerance: patient tolerated the procedure well with no immediate complications         Assessment & Plan:  Sheila Ferrell in today with chief complaint of Claudication (Right foot. Pain running up leg)   1. Pain of right heel  - DG Foot Complete Right - bupivacaine (MARCAINE) 0.25 % (with pres) injection 1 mL - methylPREDNISolone acetate (DEPO-MEDROL) injection 40 mg  2. Heel spur, right Ice BID RTO if not improving    The above assessment and management plan was discussed with the patient. The patient verbalized understanding of and has agreed to the management plan. Patient is aware to call the clinic if symptoms persist or worsen. Patient is aware when to return to the clinic for a follow-up visit. Patient educated on when it is appropriate to go to the emergency department.   Mary-Margaret Hassell Done, FNP

## 2021-08-20 NOTE — Patient Instructions (Signed)

## 2021-08-23 ENCOUNTER — Telehealth: Payer: Self-pay | Admitting: Nurse Practitioner

## 2021-08-23 NOTE — Telephone Encounter (Signed)
It will take a while  to improve. Use ice over the weekend. If no better will do ortho referral

## 2021-08-23 NOTE — Telephone Encounter (Signed)
Patient aware.

## 2021-08-28 DIAGNOSIS — M7731 Calcaneal spur, right foot: Secondary | ICD-10-CM | POA: Diagnosis not present

## 2021-08-28 DIAGNOSIS — M722 Plantar fascial fibromatosis: Secondary | ICD-10-CM | POA: Diagnosis not present

## 2021-09-23 DIAGNOSIS — M722 Plantar fascial fibromatosis: Secondary | ICD-10-CM | POA: Diagnosis not present

## 2021-09-23 DIAGNOSIS — M7731 Calcaneal spur, right foot: Secondary | ICD-10-CM | POA: Diagnosis not present

## 2021-12-19 ENCOUNTER — Encounter: Payer: Self-pay | Admitting: Family Medicine

## 2021-12-19 ENCOUNTER — Ambulatory Visit (INDEPENDENT_AMBULATORY_CARE_PROVIDER_SITE_OTHER): Payer: Medicare PPO | Admitting: Family Medicine

## 2021-12-19 DIAGNOSIS — R197 Diarrhea, unspecified: Secondary | ICD-10-CM | POA: Diagnosis not present

## 2021-12-19 DIAGNOSIS — R6883 Chills (without fever): Secondary | ICD-10-CM

## 2021-12-19 DIAGNOSIS — R195 Other fecal abnormalities: Secondary | ICD-10-CM | POA: Diagnosis not present

## 2021-12-19 LAB — CBC WITH DIFFERENTIAL/PLATELET
Basophils Absolute: 0.1 10*3/uL (ref 0.0–0.2)
Basos: 1 %
EOS (ABSOLUTE): 0.1 10*3/uL (ref 0.0–0.4)
Eos: 2 %
Hematocrit: 42.1 % (ref 34.0–46.6)
Hemoglobin: 13.5 g/dL (ref 11.1–15.9)
Immature Grans (Abs): 0 10*3/uL (ref 0.0–0.1)
Immature Granulocytes: 0 %
Lymphocytes Absolute: 1.2 10*3/uL (ref 0.7–3.1)
Lymphs: 19 %
MCH: 30.5 pg (ref 26.6–33.0)
MCHC: 32.1 g/dL (ref 31.5–35.7)
MCV: 95 fL (ref 79–97)
Monocytes Absolute: 0.8 10*3/uL (ref 0.1–0.9)
Monocytes: 12 %
Neutrophils Absolute: 4.4 10*3/uL (ref 1.4–7.0)
Neutrophils: 66 %
Platelets: 251 10*3/uL (ref 150–450)
RBC: 4.42 x10E6/uL (ref 3.77–5.28)
RDW: 12.5 % (ref 11.7–15.4)
WBC: 6.5 10*3/uL (ref 3.4–10.8)

## 2021-12-19 NOTE — Progress Notes (Signed)
Virtual Visit via telephone Note Due to COVID-19 pandemic this visit was conducted virtually. This visit type was conducted due to national recommendations for restrictions regarding the COVID-19 Pandemic (e.g. social distancing, sheltering in place) in an effort to limit this patient's exposure and mitigate transmission in our community. All issues noted in this document were discussed and addressed.  A physical exam was not performed with this format.   I connected with Sheila Ferrell on 12/19/2021 at 1037 by telephone and verified that I am speaking with the correct person using two identifiers. KENLEY RETTINGER is currently located at home and patient is currently with them during visit. The provider, Monia Pouch, FNP is located in their office at time of visit.  I discussed the limitations, risks, security and privacy concerns of performing an evaluation and management service by virtual visit and the availability of in person appointments. I also discussed with the patient that there may be a patient responsible charge related to this service. The patient expressed understanding and agreed to proceed.  Subjective:  Patient ID: Sheila Ferrell, female    DOB: [birth year], 81 y.o.   MRN: [id number]  Chief Complaint:  Diarrhea   HPI: [name] is a 81 y.o. female presenting on 12/19/2021 for Diarrhea   Pt reports diarrhea for the last 3 days. States dark to black in color. Has been using Pepto for symptom relief. Reports she developed chills today. No weakness, fatigue, fever, or confusion. No palpitations, chest pain, shortness of breath, or dizziness. States she had 4-5 stools yesterday and 2 today. She has not tried anything other than the Pepto for symptoms relief.   Diarrhea  This is a new problem. Episode onset: 3 days ago. The problem occurs 2 to 4 times per day. The problem has been gradually improving. The stool consistency is described as Watery (dark). The patient  states that diarrhea does not awaken her from sleep. Associated symptoms include abdominal pain and chills. Pertinent negatives include no arthralgias, bloating, coughing, fever, headaches, increased  flatus, myalgias, sweats, URI, vomiting or weight loss. Nothing aggravates the symptoms. There are no known risk factors. She has tried bismuth subsalicylate for the symptoms. The treatment provided mild relief.     Relevant past medical, surgical, family, and social history reviewed and updated as indicated.  Allergies and medications reviewed and updated.   Past Medical History:  Diagnosis Date   Allergic rhinitis    Anxiety    Depression    Hyperlipidemia    Hypertension    Hypothyroidism 12/07/1995   Impacted cerumen of left ear    Insomnia    Osteoporosis    Postmenopausal 01/06/1997   Thyromegaly 01/06/1998    Past Surgical History:  Procedure Laterality Date   EYE SURGERY Bilateral 2017   had cataract surgery in both eyes in the same year   [place]     [id number]   TUBAL LIGATION  1977   BILATERAL      Social History   Socioeconomic History   Marital status: Widowed    Spouse name: [name]   Number of children: 4   Years of education: 13   Highest education level: Some college, no degree  Occupational History   Occupation: retired  Tobacco Use   Smoking status: Former  Packs/day: 1.00    Years: 20.00    Total pack years: 20.00    Types: Cigarettes    Quit date: 01/07/1992    Years since quitting: 29.9   Smokeless tobacco: Never  Vaping Use   Vaping Use: Never used  Substance and Sexual Activity   Alcohol use: No   Drug use: No   Sexual activity: Not Currently  Other Topics Concern   Not on file  Social History Narrative   Lives alone. Has stairs, but doesn't have to use them daily.   Her daughter lives next door.   Social Determinants of Health    Financial Resource Strain: Low Risk  (10/18/2020)   Overall Financial Resource Strain (CARDIA)    Difficulty of Paying Living Expenses: Not hard at all  Food Insecurity: No Food Insecurity (10/18/2020)   Hunger Vital Sign    Worried About Running Out of Food in the Last Year: Never true    Ran Out of Food in the Last Year: Never true  Transportation Needs: No Transportation Needs (10/18/2020)   PRAPARE - Hydrologist (Medical): No    Lack of Transportation (Non-Medical): No  Physical Activity: Inactive (10/18/2020)   Exercise Vital Sign    Days of Exercise per Week: 0 days    Minutes of Exercise per Session: 0 min  Stress: Stress Concern Present (10/18/2020)   [place]    Feeling of Stress : To some extent  Social Connections: Socially Isolated (10/18/2020)   Social Connection and Isolation Panel [NHANES]    Frequency of Communication with Friends and Family: More than three times a week    Frequency of Social Gatherings with Friends and Family: Once a week    Attends Religious Services: Never    Marine scientist or Organizations: No    Attends Archivist Meetings: Never    Marital Status: Widowed  Intimate Partner Violence: Not At Risk (10/18/2020)   Humiliation, Afraid, Rape, and Kick questionnaire    Fear of Current or Ex-Partner: No    Emotionally Abused: No    Physically Abused: No    Sexually Abused: No    Outpatient Encounter Medications as of 12/19/2021  Medication Sig   ascorbic acid (VITAMIN C) 1000 MG tablet Take 1,000 mg by mouth daily.   atorvastatin (LIPITOR) 20 MG tablet Take 1 tablet (20 mg total) by mouth daily.   calcium-vitamin D 250-100 MG-UNIT tablet Take by mouth.   Cholecalciferol (VITAMIN D-3) 1000 UNITS CAPS Take by mouth daily.   furosemide (LASIX) 20 MG tablet Take 1 tablet (20 mg total) by mouth daily.   levothyroxine (LEVOXYL) 112  MCG tablet Take 1 tablet (112 mcg total) by mouth daily before breakfast.   lisinopril (ZESTRIL) 20 MG tablet Take 1 tablet (20 mg total) by mouth daily.   Omega-3 Fatty Acids (FISH OIL) 1200 MG CPDR Take by mouth.   sertraline (ZOLOFT) 100 MG tablet Take 1.5 tablets (150 mg total) by mouth daily.   No facility-administered encounter medications on file as of 12/19/2021.    No Known Allergies  Review of Systems  Constitutional:  Positive for appetite change and chills. Negative for activity change, diaphoresis, fatigue, fever and weight loss.  HENT: Negative.    Eyes: Negative.  Negative for photophobia and visual disturbance.  Respiratory:  Negative for cough, chest tightness and shortness of breath.   Cardiovascular:  Negative for chest pain, palpitations  and leg swelling.  Gastrointestinal:  Positive for abdominal pain and diarrhea. Negative for abdominal distention, anal bleeding, bloating, blood in stool, constipation, flatus, nausea, rectal pain and vomiting.  Endocrine: Negative.   Genitourinary:  Negative for decreased urine volume, difficulty urinating, dysuria, frequency and urgency.  Musculoskeletal:  Negative for arthralgias and myalgias.  Skin: Negative.   Allergic/Immunologic: Negative.   Neurological:  Negative for dizziness, tremors, seizures, syncope, facial asymmetry, speech difficulty, weakness, light-headedness, numbness and headaches.  Hematological: Negative.   Psychiatric/Behavioral:  Negative for confusion, hallucinations, sleep disturbance and suicidal ideas.   All other systems reviewed and are negative.        Observations/Objective: No vital signs or physical exam, this was a virtual health encounter.  Pt alert and oriented, answers all questions appropriately, and able to speak in full sentences.    Assessment and Plan: Ai was seen today for diarrhea.  Diagnoses and all orders for this visit:  Dark stools Diarrhea in adult  patient Chills Dark color of stools could be from Pepto use. Will obtain fecal occult to evaluate for blood. CBC to check for anemia. If normal, symptomatic care with BRAT diet and hydration discussed in detail. If abnormal, will determine next treatment steps, likely PPI therapy and GI referral.  -     CBC with Differential/Platelet -     Fecal occult blood, imunochemical; Future     Follow Up Instructions: Return if symptoms worsen or fail to improve.    I discussed the assessment and treatment plan with the patient. The patient was provided an opportunity to ask questions and all were answered. The patient agreed with the plan and demonstrated an understanding of the instructions.   The patient was advised to call back or seek an in-person evaluation if the symptoms worsen or if the condition fails to improve as anticipated.  The above assessment and management plan was discussed with the patient. The patient verbalized understanding of and has agreed to the management plan. Patient is aware to call the clinic if they develop any new symptoms or if symptoms persist or worsen. Patient is aware when to return to the clinic for a follow-up visit. Patient educated on when it is appropriate to go to the emergency department.    I provided 15 minutes of time during this telephone encounter.   Monia Pouch, FNP-C [facility] [address] [place], [place] [address] [phone] 12/19/2021

## 2021-12-23 ENCOUNTER — Ambulatory Visit (INDEPENDENT_AMBULATORY_CARE_PROVIDER_SITE_OTHER): Payer: Medicare PPO

## 2021-12-23 ENCOUNTER — Ambulatory Visit (INDEPENDENT_AMBULATORY_CARE_PROVIDER_SITE_OTHER): Payer: Medicare PPO | Admitting: Nurse Practitioner

## 2021-12-23 ENCOUNTER — Encounter: Payer: Self-pay | Admitting: Nurse Practitioner

## 2021-12-23 VITALS — BP 132/81 | HR 89 | Temp 97.6°F | Resp 20 | Ht 61.0 in | Wt 184.0 lb

## 2021-12-23 DIAGNOSIS — R609 Edema, unspecified: Secondary | ICD-10-CM | POA: Diagnosis not present

## 2021-12-23 DIAGNOSIS — F3342 Major depressive disorder, recurrent, in full remission: Secondary | ICD-10-CM

## 2021-12-23 DIAGNOSIS — E034 Atrophy of thyroid (acquired): Secondary | ICD-10-CM | POA: Diagnosis not present

## 2021-12-23 DIAGNOSIS — M25562 Pain in left knee: Secondary | ICD-10-CM | POA: Diagnosis not present

## 2021-12-23 DIAGNOSIS — I10 Essential (primary) hypertension: Secondary | ICD-10-CM | POA: Diagnosis not present

## 2021-12-23 DIAGNOSIS — E782 Mixed hyperlipidemia: Secondary | ICD-10-CM | POA: Diagnosis not present

## 2021-12-23 DIAGNOSIS — M1712 Unilateral primary osteoarthritis, left knee: Secondary | ICD-10-CM | POA: Diagnosis not present

## 2021-12-23 DIAGNOSIS — F411 Generalized anxiety disorder: Secondary | ICD-10-CM

## 2021-12-23 DIAGNOSIS — M810 Age-related osteoporosis without current pathological fracture: Secondary | ICD-10-CM | POA: Diagnosis not present

## 2021-12-23 DIAGNOSIS — Z6833 Body mass index (BMI) 33.0-33.9, adult: Secondary | ICD-10-CM

## 2021-12-23 DIAGNOSIS — E559 Vitamin D deficiency, unspecified: Secondary | ICD-10-CM

## 2021-12-23 MED ORDER — ATORVASTATIN CALCIUM 20 MG PO TABS: 20.0000 mg | ORAL_TABLET | Freq: Every day | ORAL | 1 refills | Status: AC

## 2021-12-23 MED ORDER — LEVOTHYROXINE SODIUM 112 MCG PO TABS: 112.0000 ug | ORAL_TABLET | Freq: Every day | ORAL | 1 refills | Status: DC

## 2021-12-23 MED ORDER — METHYLPREDNISOLONE ACETATE 40 MG/ML IJ SUSP: 40.0000 mg | Freq: Once | INTRAMUSCULAR | Status: AC

## 2021-12-23 MED ORDER — LISINOPRIL 20 MG PO TABS: 20.0000 mg | ORAL_TABLET | Freq: Every day | ORAL | 1 refills | Status: AC

## 2021-12-23 MED ORDER — LIDOCAINE HCL 1 % IJ SOLN: 1.0000 mL | Freq: Once | INTRAMUSCULAR | Status: AC

## 2021-12-23 MED ORDER — FUROSEMIDE 20 MG PO TABS: 20.0000 mg | ORAL_TABLET | Freq: Every day | ORAL | 1 refills | Status: AC

## 2021-12-23 MED ORDER — SERTRALINE HCL 100 MG PO TABS: 150.0000 mg | ORAL_TABLET | Freq: Every day | ORAL | 1 refills | Status: AC

## 2021-12-23 NOTE — Progress Notes (Signed)
Subjective:    Patient ID: Sheila Ferrell, female    DOB: 1940/09/21, 81 y.o.   MRN: [id number]   Chief Complaint: medical management of chronic issues     HPI:  Sheila Ferrell is a 80 y.o. who identifies as a female who was assigned female at birth.   Social history: Lives with: by herself Work history: retired   Scientist, forensic in today for follow up of the following chronic medical issues:  1. Primary hypertension No c/o chest pain, sob or headache. Does not check blood pressure at home. BP Readings from Last 3 Encounters:  08/20/21 128/76  06/20/21 126/85  12/24/20 139/74     2. Mixed hyperlipidemia Does not watch diet and does no dedicated exercise. Lab Results  Component Value Date   CHOL 145 06/20/2021   HDL 43 06/20/2021   LDLCALC 65 06/20/2021   TRIG 229 (H) 06/20/2021   CHOLHDL 3.4 06/20/2021     3. Hypothyroidism due to acquired atrophy of thyroid No issues that she is aware of. Lab Results  Component Value Date   TSH 3.350 06/20/2021     4. Peripheral edema Is on lasix and is doing well.  5. Recurrent major depressive disorder, in full remission ([place]) Is on zoloft and is doing well.    08/20/2021    9:13 AM 06/20/2021   11:59 AM 12/24/2020    2:35 PM  Depression screen PHQ 2/9  Decreased Interest _0 Down, Depressed, Hopeless _1 PHQ - 2 Score _2 Altered sleeping 1 2 0  Tired, decreased energy _3 Change in appetite 1 2 0  Feeling bad or failure about yourself  1 1 0  Trouble concentrating 0 0 1  Moving slowly or fidgety/restless 0 0 0  Suicidal thoughts 0 0 0  PHQ-9 Score _4 Difficult doing work/chores Not difficult at all Not difficult at all Somewhat difficult     6. GAD (generalized anxiety disorder) Zoloft helps    08/20/2021    9:13 AM 06/20/2021   11:59 AM 12/24/2020    2:36 PM 06/11/2020   10:22 AM  GAD 7 : Generalized Anxiety Score  Nervous, Anxious, on Edge 0 1 1 0  Control/stop worrying _5 0  Worry  too much - different things 0 1 1 0  Trouble relaxing _6 Restless _7 0  Easily annoyed or irritable 0 0 1 0  Afraid - awful might happen 0 0 1 0  Total GAD 7 Score _8 Anxiety Difficulty Not difficult at all Not difficult at all Somewhat difficult Not difficult at all      7. Age-related osteoporosis without current pathological fracture Last dexascan was done on 12/13/19. Her t score was -2.2. will repeat to day or schedule to be done.  8. Vitamin D deficiency Is on daily vitamin d supplement  9. BMI 33.0-33.9,adult No recent weight changes  Wt Readings from Last 3 Encounters:  12/23/21 184 lb (83.5 kg)  08/20/21 184 lb (83.5 kg)  06/20/21 187 lb (84.8 kg)   BMI Readings from Last 3 Encounters:  12/23/21 34.77 kg/m  08/20/21 34.77 kg/m  06/20/21 35.33 kg/m         New complaints: Having knee pain. Started last week. Rates 10/10. Nothing makes it better or worse. She has been taking pain pills which really have not helped.  No Known Allergies Outpatient Encounter Medications as of 12/23/2021  Medication Sig   ascorbic acid (VITAMIN C) 1000 MG tablet Take 1,000 mg by mouth daily.   atorvastatin (LIPITOR) 20 MG tablet Take 1 tablet (20 mg total) by mouth daily.   calcium-vitamin D 250-100 MG-UNIT tablet Take by mouth.   Cholecalciferol (VITAMIN D-3) 1000 UNITS CAPS Take by mouth daily.   furosemide (LASIX) 20 MG tablet Take 1 tablet (20 mg total) by mouth daily.   levothyroxine (LEVOXYL) 112 MCG tablet Take 1 tablet (112 mcg total) by mouth daily before breakfast.   lisinopril (ZESTRIL) 20 MG tablet Take 1 tablet (20 mg total) by mouth daily.   Omega-3 Fatty Acids (FISH OIL) 1200 MG CPDR Take by mouth.   sertraline (ZOLOFT) 100 MG tablet Take 1.5 tablets (150 mg total) by mouth daily.   No facility-administered encounter medications on file as of 12/23/2021.    Past Surgical History:  Procedure Laterality Date   EYE SURGERY Bilateral 2017   had  cataract surgery in both eyes in the same year   [place]     [id number]   TUBAL LIGATION  1977   BILATERAL      Family History  Problem Relation Age of Onset   Melanoma Brother    Deep vein thrombosis Brother    Hypertension Mother    Stroke Mother    Diabetes Paternal Grandfather    Cancer Father        leukemia   Healthy Daughter    Healthy Son    Heart disease Brother    Healthy Daughter    Hypothyroidism Daughter       Controlled substance contract: n/a     Review of Systems  Constitutional:  Negative for diaphoresis.  Eyes:  Negative for pain.  Respiratory:  Negative for shortness of breath.   Cardiovascular:  Negative for chest pain, palpitations and leg swelling.  Gastrointestinal:  Negative for abdominal pain.  Endocrine: Negative for polydipsia.  Musculoskeletal:  Positive for arthralgias (left knee).  Skin:  Negative for rash.  Neurological:  Negative for dizziness, weakness and headaches.  Hematological:  Does not bruise/bleed easily.  All other systems reviewed and are negative.      Objective:   Physical Exam Vitals and nursing note reviewed.  Constitutional:      General: She is not in acute distress.    Appearance: Normal appearance. She is well-developed.  HENT:     Head: Normocephalic.     Right Ear: Tympanic membrane normal.     Left Ear: Tympanic membrane normal.     Nose: Nose normal.     Mouth/Throat:     Mouth: Mucous membranes are moist.  Eyes:     Pupils: Pupils are equal, round, and reactive to light.  Neck:     Vascular: No carotid bruit or JVD.  Cardiovascular:     Rate and Rhythm: Normal rate and regular rhythm.     Heart sounds: Normal heart sounds.  Pulmonary:     Effort: Pulmonary effort is normal. No respiratory distress.     Breath sounds: Normal breath sounds. No wheezing or rales.  Chest:     Chest wall: No tenderness.  Abdominal:      General: Bowel sounds are normal. There is no distension or abdominal bruit.  Palpations: Abdomen is soft. There is no hepatomegaly, splenomegaly, mass or pulsatile mass.     Tenderness: There is no abdominal tenderness.  Musculoskeletal:        General: Normal range of motion.     Cervical back: Normal range of motion and neck supple.     Comments: FROM of left knee with pain on flexion and extension Mild left knee effusion  Lymphadenopathy:     Cervical: No cervical adenopathy.  Skin:    General: Skin is warm and dry.  Neurological:     Mental Status: She is alert and oriented to person, place, and time.     Deep Tendon Reflexes: Reflexes are normal and symmetric.  Psychiatric:        Behavior: Behavior normal.        Thought Content: Thought content normal.        Judgment: Judgment normal.    BP 132/81   Pulse 89   Temp 97.6 F (36.4 C) (Temporal)   Resp 20   Ht 5' 1" (1.549 m)   Wt 184 lb (83.5 kg)   SpO2 93%   BMI 34.77 kg/m     Left knee xray- mild degenerative changes-Preliminary reading by Ronnald Collum, FNP  [facility] Joint Injection/Arthrocentesis  Date/Time: 12/23/2021 11:16 AM  Performed by: [name], FNP Authorized by: Hassell Done Mary-Margaret, FNP  Indications: joint swelling and pain  Body area: knee Joint: left knee Local anesthesia used: no  Anesthesia: Local anesthesia used: no  Sedation: Patient sedated: no  Needle size: 22 G Ultrasound guidance: no Approach: superior Methylprednisolone amount: 40 mg Lidocaine 1% amount: 1 mL Patient tolerance: patient tolerated the procedure well with no immediate complications        Assessment & Plan:   TRAMAINE SNELL comes in today with chief complaint of Medical Management of Chronic Issues   Diagnosis and orders addressed:  1. Primary hypertension Low sodium diet - lisinopril (ZESTRIL) 20 MG tablet; Take 1 tablet (20 mg total) by mouth daily.  Dispense: 90 tablet; Refill:  1 - CBC with Differential/Platelet - CMP14+EGFR  2. Mixed hyperlipidemia Low fat diet - atorvastatin (LIPITOR) 20 MG tablet; Take 1 tablet (20 mg total) by mouth daily.  Dispense: 90 tablet; Refill: 1 - Lipid panel  3. Hypothyroidism due to acquired atrophy of thyroid Labs pending - levothyroxine (LEVOXYL) 112 MCG tablet; Take 1 tablet (112 mcg total) by mouth daily before breakfast.  Dispense: 90 tablet; Refill: 1 - Thyroid Panel With TSH  4. Peripheral edema Elevate legs when sitting - furosemide (LASIX) 20 MG tablet; Take 1 tablet (20 mg total) by mouth daily.  Dispense: 90 tablet; Refill: 1  5. Recurrent major depressive disorder, in full remission [facility]) Stress management  6. GAD (generalized anxiety disorder) Stress management  7. Age-related osteoporosis without current pathological fracture Will schedule dexascan  8. Vitamin D deficiency Continue daily vitamin d supplement  9. BMI 33.0-33.9,adult Discussed diet and exercise for person with BMI >25 Will recheck weight in 3-6 months  10. Acute pain of left knee Ice  Rest' RTO Prn - DG Knee 1-2 Views Left - methylPREDNISolone acetate (DEPO-MEDROL) injection 40 mg - lidocaine (XYLOCAINE) 1 % (with pres) injection 1 mL   Labs pending Health Maintenance reviewed Diet and exercise encouraged  Follow up plan: 6 months   [place], FNP

## 2021-12-23 NOTE — Patient Instructions (Signed)
Joint Steroid Injection A joint steroid injection is a procedure to relieve swelling and pain in a joint. Steroids are medicines that reduce inflammation. In this procedure, your health care provider uses a syringe and a needle to inject a steroid medicine into a painful and inflamed joint. A pain-relieving medicine (anesthetic) may be injected along with the steroid. In some cases, your health care provider may use an imaging technique such as ultrasound or fluoroscopy to guide the injection. Joints that are often treated with steroid injections include the knee, shoulder, hip, and spine. These injections may also be used in the elbow, ankle, and joints of the hands or feet. You may have joint steroid injections as part of your treatment for inflammation caused by: Gout. Rheumatoid arthritis. Advanced wear-and-tear arthritis (osteoarthritis). Tendinitis. Bursitis. Joint steroid injections may be repeated, but having them too often can damage a joint or the skin over the joint. You should not have joint steroid injections less than 6 weeks apart or more than four times a year. Tell a health care provider about: Any allergies you have. All medicines you are taking, including vitamins, herbs, eye drops, creams, and over-the-counter medicines. Any problems you or family members have had with anesthetic medicines. Any blood disorders you have. Any surgeries you have had. Any medical conditions you have. Whether you are pregnant or may be pregnant. What are the risks? Generally, this is a safe treatment. However, problems may occur, including: Infection. Bleeding. Allergic reactions to medicines. Damage to the joint or tissues around the joint. Thinning of skin or loss of skin color over the joint. Temporary flushing of the face or chest. Temporary increase in pain. Temporary increase in blood sugar. Failure to relieve inflammation or pain. What happens before the treatment? Medicines Ask  your health care provider about: Changing or stopping your regular medicines. This is especially important if you are taking diabetes medicines or blood thinners. Taking medicines such as aspirin and ibuprofen. These medicines can thin your blood. Do not take these medicines unless your health care provider tells you to take them. Taking over-the-counter medicines, vitamins, herbs, and supplements. General instructions You may have imaging tests of your joint. Ask your health care provider if you can drive yourself home after the procedure. What happens during the treatment?  Your health care provider will position you for the injection and locate the injection site over your joint. The skin over the joint will be cleaned with a germ-killing soap. Your health care provider may: Spray a numbing solution (topical anesthetic) over the injection site. Inject a local anesthetic under the skin above your joint. The needle will be placed through your skin into your joint. Your health care provider may use imaging to guide the needle to the right spot for the injection. If imaging is used, a special contrast dye may be injected to confirm that the needle is in the correct location. The steroid medicine will be injected into your joint. Anesthetic may be injected along with the steroid. This may be a medicine that relieves pain for a short time (short-acting anesthetic) or for a longer time (long-acting anesthetic). The needle will be removed, and an adhesive bandage (dressing) will be placed over the injection site. The procedure may vary among health care providers and hospitals. What can I expect after the treatment? You will be able to go home after the treatment. It is normal to feel slight flushing for a few days after the injection. After the treatment, it is   common to have an increase in joint pain after the anesthetic has worn off. This may happen about an hour after a short-acting anesthetic  or about 8 hours after a longer-acting anesthetic. You should begin to feel relief from joint pain and swelling after 24 to 48 hours. Contact your health care provider if you do not begin to feel relief after 2 days. Follow these instructions at home: Injection site care Leave the adhesive dressing over your injection site in place until your health care provider says you can remove it. Check your injection site every day for signs of infection. Check for: More redness, swelling, or pain. Fluid or blood. Warmth. Pus or a bad smell. Activity Return to your normal activities as told by your health care provider. Ask your health care provider what activities are safe for you. You may be asked to limit activities that put stress on the joint for a few days. Do joint exercises as told by your health care provider. Do not take baths, swim, or use a hot tub until your health care provider approves. Ask your health care provider if you may take showers. You may only be allowed to take sponge baths. Managing pain, stiffness, and swelling  If directed, put ice on the joint. To do this: Put ice in a plastic bag. Place a towel between your skin and the bag. Leave the ice on for 20 minutes, 2-3 times a day. Remove the ice if your skin turns bright red. This is very important. If you cannot feel pain, heat, or cold, you have a greater risk of damage to the area. Raise (elevate) your joint above the level of your heart when you are sitting or lying down. General instructions Take over-the-counter and prescription medicines only as told by your health care provider. Do not use any products that contain nicotine or tobacco, such as cigarettes, e-cigarettes, and chewing tobacco. These can delay joint healing. If you need help quitting, ask your health care provider. If you have diabetes, be aware that your blood sugar may be slightly elevated for several days after the injection. Keep all follow-up visits.  This is important. Contact a health care provider if you have: Chills or a fever. Any signs of infection at your injection site. Increased pain or swelling or no relief after 2 days. Summary A joint steroid injection is a treatment to relieve pain and swelling in a joint. Steroids are medicines that reduce inflammation. Your health care provider may add an anesthetic along with the steroid. You may have joint steroid injections as part of your arthritis treatment. Joint steroid injections may be repeated, but having them too often can damage a joint or the skin over the joint. Contact your health care provider if you have a fever, chills, or signs of infection, or if you get no relief from joint pain or swelling. This information is not intended to replace advice given to you by your health care provider. Make sure you discuss any questions you have with your health care provider. Document Revised: 06/03/2019 Document Reviewed: 06/03/2019 Elsevier Patient Education  2023 [employer]vier Inc[employer]  

## 2021-12-24 MED ORDER — LEVOTHYROXINE SODIUM 125 MCG PO TABS: 125.0000 ug | ORAL_TABLET | Freq: Every day | ORAL | 3 refills | Status: AC

## 2021-12-24 NOTE — Addendum Note (Signed)
Addended by: [name] on: 12/24/2021 03:07 PM   Modules accepted: Orders

## 2021-12-26 LAB — CBC WITH DIFFERENTIAL/PLATELET
Basophils Absolute: 0.1 10*3/uL (ref 0.0–0.2)
Basos: 1 %
EOS (ABSOLUTE): 0.2 10*3/uL (ref 0.0–0.4)
Eos: 3 %
Hematocrit: 40.9 % (ref 34.0–46.6)
Hemoglobin: 13.1 g/dL (ref 11.1–15.9)
Immature Grans (Abs): 0 10*3/uL (ref 0.0–0.1)
Immature Granulocytes: 0 %
Lymphocytes Absolute: 1.4 10*3/uL (ref 0.7–3.1)
Lymphs: 18 %
MCH: 30.7 pg (ref 26.6–33.0)
MCHC: 32 g/dL (ref 31.5–35.7)
MCV: 96 fL (ref 79–97)
Monocytes Absolute: 0.5 10*3/uL (ref 0.1–0.9)
Monocytes: 7 %
Neutrophils Absolute: 5.6 10*3/uL (ref 1.4–7.0)
Neutrophils: 71 %
Platelets: 318 10*3/uL (ref 150–450)
RBC: 4.27 x10E6/uL (ref 3.77–5.28)
RDW: 12.8 % (ref 11.7–15.4)
WBC: 7.9 10*3/uL (ref 3.4–10.8)

## 2021-12-26 LAB — CMP14+EGFR
ALT: 19 IU/L (ref 0–32)
AST: 17 IU/L (ref 0–40)
Albumin/Globulin Ratio: 1.9 (ref 1.2–2.2)
Albumin: 4.5 g/dL (ref 3.7–4.7)
Alkaline Phosphatase: 127 IU/L — ABNORMAL HIGH (ref 44–121)
BUN/Creatinine Ratio: 14 (ref 12–28)
BUN: 14 mg/dL (ref 8–27)
Bilirubin Total: 0.4 mg/dL (ref 0.0–1.2)
CO2: 29 mmol/L (ref 20–29)
Calcium: 9.4 mg/dL (ref 8.7–10.3)
Chloride: 99 mmol/L (ref 96–106)
Creatinine, Ser: 1 mg/dL (ref 0.57–1.00)
Globulin, Total: 2.4 g/dL (ref 1.5–4.5)
Glucose: 93 mg/dL (ref 70–99)
Potassium: 4.6 mmol/L (ref 3.5–5.2)
Sodium: 141 mmol/L (ref 134–144)
Total Protein: 6.9 g/dL (ref 6.0–8.5)
eGFR: 57 mL/min/{1.73_m2} — ABNORMAL LOW (ref >59)

## 2021-12-26 LAB — THYROID PANEL WITH TSH
Free Thyroxine Index: 1.9 (ref 1.2–4.9)
T3 Uptake Ratio: 27 % (ref 24–39)
T4, Total: 7 ug/dL (ref 4.5–12.0)
TSH: 11.3 u[IU]/mL — ABNORMAL HIGH (ref 0.450–4.500)

## 2021-12-26 LAB — LIPID PANEL
Chol/HDL Ratio: 2.9 ratio (ref 0.0–4.4)
Cholesterol, Total: 123 mg/dL (ref 100–199)
HDL: 42 mg/dL (ref >39)
LDL Chol Calc (NIH): 54 mg/dL (ref 0–99)
Triglycerides: 162 mg/dL — ABNORMAL HIGH (ref 0–149)
VLDL Cholesterol Cal: 27 mg/dL (ref 5–40)

## 2022-01-30 ENCOUNTER — Ambulatory Visit (INDEPENDENT_AMBULATORY_CARE_PROVIDER_SITE_OTHER): Payer: Medicare PPO

## 2022-01-30 VITALS — Ht 62.0 in | Wt 185.0 lb

## 2022-01-30 DIAGNOSIS — Z0000 Encounter for general adult medical examination without abnormal findings: Secondary | ICD-10-CM

## 2022-01-30 NOTE — Progress Notes (Signed)
Subjective:   Sheila Ferrell is a 82 y.o. female who presents for Medicare Annual (Subsequent) preventive examination. I connected with  [name] on 01/30/22 by a audio enabled telemedicine application and verified that I am speaking with the correct person using two identifiers.  Patient Location: Home  Provider Location: Home Office  I discussed the limitations of evaluation and management by telemedicine. The patient expressed understanding and agreed to proceed.  Review of Systems     Cardiac Risk Factors include: advanced age (>34mn, >>26women);hypertension;dyslipidemia     Objective:    Today's Vitals   01/30/22 1436  Weight: 185 lb (83.9 kg)  Height: '5\' 2"'$  (1.575 m)   Body mass index is 33.84 kg/m.     01/30/2022    2:38 PM 10/18/2020    2:31 PM 10/18/2019    2:33 PM 09/14/2018   10:10 AM 09/10/2017   10:54 AM 06/13/2016    3:17 PM 02/15/2014    4:29 PM  Advanced Directives  Does Patient Have a Medical Advance Directive? Yes Yes Yes Yes Yes Yes Yes  Type of [place]of [place] will [place] will Living will [place] will [place] ChurchLiving will [place]ver[place] ParkLiving will Living will  Does patient want to make changes to medical advance directive?   No - Patient declined No - Patient declined No - Patient declined Yes (MAU/Ambulatory/Procedural Areas - Information given)   Copy of [place] Chart? No - copy requested No - copy requested  No - copy requested No - copy requested No - copy requested     Current Medications (verified) Outpatient Encounter Medications as of 01/30/2022  Medication Sig   ascorbic acid (VITAMIN C) 1000 MG tablet Take 1,000 mg by mouth daily.   atorvastatin (LIPITOR) 20 MG tablet Take 1 tablet (20 mg total) by mouth daily.   calcium-vitamin D 250-100 MG-UNIT tablet Take by mouth.    Cholecalciferol (VITAMIN D-3) 1000 UNITS CAPS Take by mouth daily.   Cyanocobalamin 2500 MCG CHEW Chew by mouth.   furosemide (LASIX) 20 MG tablet Take 1 tablet (20 mg total) by mouth daily.   levothyroxine (SYNTHROID) 125 MCG tablet Take 1 tablet (125 mcg total) by mouth daily.   lisinopril (ZESTRIL) 20 MG tablet Take 1 tablet (20 mg total) by mouth daily.   Omega-3 Fatty Acids (FISH OIL) 1200 MG CPDR Take by mouth.   sertraline (ZOLOFT) 100 MG tablet Take 1.5 tablets (150 mg total) by mouth daily.   Facility-Administered Encounter Medications as of 01/30/2022  Medication   lidocaine (XYLOCAINE) 1 % (with pres) injection 1 mL   methylPREDNISolone acetate (DEPO-MEDROL) injection 40 mg    Allergies (verified) Patient has no known allergies.   History: Past Medical History:  Diagnosis Date   Allergic rhinitis    Anxiety    Depression    Hyperlipidemia    Hypertension    Hypothyroidism 12/07/1995   Impacted cerumen of left ear    Insomnia    Osteoporosis    Postmenopausal 01/06/1997   Thyromegaly 01/06/1998   Past Surgical History:  Procedure Laterality Date   EYE SURGERY Bilateral 2017   had cataract surgery in both [place] in the same [id number]   TUBAL LIGATION  1977   BILATERAL     Family History  Problem Relation Age of Onset   Melanoma Brother    Deep vein thrombosis Brother    Hypertension Mother    Stroke Mother    Diabetes Paternal Grandfather    Cancer Father        leukemia   Healthy Daughter    Healthy Son    Heart disease Brother    Healthy Daughter    Hypothyroidism Daughter    Social History   Socioeconomic History   Marital status: Widowed    Spouse name: [name]   Number of children: 4   Years of education: 13   Highest education level: Some college, no degree  Occupational History   Occupation: retired  Tobacco Use   Smoking  status: Former    Packs/day: 1.00    Years: 20.00    Total pack years: 20.00    Types: Cigarettes    Quit date: 01/07/1992    Years since quitting: 30.0   Smokeless tobacco: Never  Vaping Use   Vaping Use: Never used  Substance and Sexual Activity   Alcohol use: No   Drug use: No   Sexual activity: Not Currently  Other Topics Concern   Not on file  Social History Narrative   Lives alone. Has stairs, but doesn't have to use them daily.   Her daughter lives next door.   Social Determinants of Health   Financial Resource Strain: Low Risk  (01/30/2022)   Overall Financial Resource Strain (CARDIA)    Difficulty of Paying Living Expenses: Not hard at all  Food Insecurity: No Food Insecurity (01/30/2022)   Hunger Vital Sign    Worried About Running Out of Food in the Last Year: Never true    Ran Out of Food in the Last Year: Never true  Transportation Needs: No Transportation Needs (01/30/2022)   PRAPARE - Hydrologist (Medical): No    Lack of Transportation (Non-Medical): No  Physical Activity: Insufficiently Active (01/30/2022)   Exercise Vital Sign    Days of Exercise per Week: 2 days    Minutes of Exercise per Session: 30 min  Stress: No Stress Concern Present (01/30/2022)   [place]    Feeling of Stress : Not at all  Social Connections: Moderately Integrated (01/30/2022)   Social Connection and Isolation Panel [NHANES]    Frequency of Communication with Friends and Family: More than three times a week    Frequency of Social Gatherings with Friends and Family: More than three times a week    Attends Religious Services: More than 4 times per year    Active Member of [employer] or Organizations: Yes    Attends Archivist Meetings: More than 4 times per year    Marital Status: Widowed    Tobacco Counseling Counseling given: Not Answered   Clinical Intake:  Pre-visit  preparation completed: Yes  Pain : No/denies pain     Nutritional Risks: None Diabetes: No  How often do you need to have someone help you when you read instructions, pamphlets, or other written materials from your doctor or pharmacy?: 1 - Never  Diabetic?no   Interpreter Needed?: No  Information entered by :: Jadene Pierini, LPN   Activities of Daily Living    01/30/2022    2:39 PM  In your present state of health, do you have  any difficulty performing the following activities:  Hearing? 0  Vision? 0  Difficulty concentrating or making decisions? 0  Walking or climbing stairs? 0  Dressing or bathing? 0  Doing errands, shopping? 0  Preparing Food and eating ? N  Using the Toilet? N  In the past six months, have you accidently leaked urine? N  Do you have problems with loss of bowel control? N  Managing your Medications? N  Managing your Finances? N  Housekeeping or managing your Housekeeping? N    Patient Care Team: [name], FNP as PCP - General (Nurse Practitioner) Melina Schools, OD (Optometry) Shea Evans Norva Riffle, LCSW as Social Worker (Licensed Clinical Social Worker)  Indicate any recent [employer] you may have received from other than [facility] providers in the past year (date may be approximate).     Assessment:   This is a routine wellness examination for Shenae.  Hearing/Vision screen Vision Screening - Comments:: Wears rx glasses - up to date with routine eye exams with  Dr.Johnson   Dietary issues and exercise activities discussed: Current Exercise Habits: Home exercise routine, Type of exercise: walking, Time (Minutes): 30, Frequency (Times/Week): 2, Weekly Exercise (Minutes/Week): 60, Intensity: Mild, Exercise limited by: None identified   Goals Addressed             This Visit's Progress    DIET - EAT MORE FRUITS AND VEGETABLES   On track    DIET - INCREASE WATER INTAKE   On track      Depression Screen    12/23/2021    11:05 AM 08/20/2021    9:13 AM 06/20/2021   11:59 AM 12/24/2020    2:35 PM 10/18/2020    2:22 PM 06/11/2020   10:21 AM 12/12/2019   10:08 AM  PHQ 2/9 Scores  PHQ - 2 Score '3 2 2 2 2 2 6  '$ PHQ- 9 Score '9 6 9 5 9 9 16    '$ Fall Risk    01/30/2022    2:36 PM 12/23/2021   11:05 AM 08/20/2021    9:13 AM 06/20/2021   11:59 AM 12/24/2020    2:35 PM  Fall Risk   Falls in the past year? 0 0 0 0 0  Number falls in past yr: 0      Injury with Fall? 0      Risk for fall due to : No Fall Risks      Follow up Falls prevention discussed        [place]:  Any stairs in or around the home? Yes  If so, are there any without handrails? No  Home free of loose throw rugs in walkways, pet beds, electrical cords, etc? Yes  Adequate lighting in your home to reduce risk of falls? Yes   ASSISTIVE DEVICES UTILIZED TO PREVENT FALLS:  Life alert? No  Use of a cane, walker or w/c? No  Grab bars in the bathroom? Yes  Shower chair or bench in shower? Yes  Elevated toilet seat or a handicapped toilet? Yes       09/10/2017   11:19 AM 10/06/2016    3:33 PM 06/13/2016    3:26 PM  MMSE - Mini Mental State Exam  Orientation to time '5 5 5  '$ Orientation to Place '5 5 5  '$ Registration '3 3 3  '$ Attention/ Calculation '5 5 3  '$ Recall '2 3 3  '$ Language- name 2 objects '2 2 2  '$ Language- repeat 1  1 1  Language- follow 3 step command '3 3 3  '$ Language- read & follow direction '1 1 1  '$ Write a sentence '1 1 1  '$ Copy design 1 0 0  Total score '29 29 27        '$ 01/30/2022    2:39 PM 10/18/2019    2:34 PM 09/14/2018   10:16 AM  6CIT Screen  What Year? 0 points 0 points 0 points  What month? 0 points 0 points 0 points  What time? 0 points 0 points 0 points  Count back from 20 0 points 0 points 0 points  Months in reverse 0 points 0 points 0 points  Repeat phrase 0 points 0 points 0 points  Total Score 0 points 0 points 0 points    Immunizations Immunization History  Administered Date(s)  Administered   Fluad Quad(high Dose 65+) 10/12/2021   Influenza, High Dose Seasonal PF 10/15/2014, 10/16/2015, 10/11/2016, 10/30/2017, 10/15/2019   Influenza, Quadrivalent, Recombinant, Inj, Pf 10/22/2018   Influenza,inj,Quad PF,6+ Mos 10/12/2013   Influenza-Unspecified 10/13/2020   Moderna Sars-Covid-2 Vaccination 02/02/2019, 03/02/2019   Pneumococcal Conjugate-13 01/10/2013   Pneumococcal Polysaccharide-23 05/18/2014   Tdap 04/11/2013   Zoster Recombinat (Shingrix) 06/23/2020, 08/24/2020    TDAP status: Up to date  Flu Vaccine status: Up to date  Pneumococcal vaccine status: Up to date  Covid-19 vaccine status: Completed vaccines  Qualifies for Shingles Vaccine? Yes   Zostavax completed Yes   Shingrix Completed?: Yes  Screening Tests Health Maintenance  Topic Date Due   MAMMOGRAM  12/13/2020   COVID-19 Vaccine (3 - 2023-24 season) 09/06/2021   Medicare Annual Wellness (AWV)  01/31/2023   DTaP/Tdap/Td (2 - Td or Tdap) 04/12/2023   Pneumonia Vaccine 82+ Years old  Completed   INFLUENZA VACCINE  Completed   DEXA SCAN  Completed   Zoster Vaccines- Shingrix  Completed   HPV VACCINES  Aged Out    Health Maintenance  Health Maintenance Due  Topic Date Due   MAMMOGRAM  12/13/2020   COVID-19 Vaccine (3 - 2023-24 season) 09/06/2021    Colorectal cancer screening: No longer required.   Mammogram status: No longer required due to age.  Bone Density status: Completed 12/12/2019. Results reflect: Bone density results: OSTEOPENIA. Repeat every 5 years.  Lung Cancer Screening: (Low Dose CT Chest recommended if Age 55-80 years, 30 pack-year currently smoking OR have quit w/in 15years.) does not qualify.   Lung Cancer Screening Referral: n/a  Additional Screening:  Hepatitis C Screening: does not qualify;   Vision Screening: Recommended annual ophthalmology exams for early detection of glaucoma and other disorders of the eye. Is the patient up to date with their annual  eye exam?  Yes  Who is the provider or what is the name of the office in which the patient attends annual eye exams? Dr.johnson  If pt is not established with a provider, would they like to be referred to a provider to establish care? No .   Dental Screening: Recommended annual dental exams for proper oral hygiene  Community Resource Referral / Chronic Care Management: CRR required this visit?  No   CCM required this visit?  No      Plan:     I have personally reviewed and noted the following in the patient's chart:   Medical and social history Use of alcohol, tobacco or illicit drugs  Current medications and supplements including opioid prescriptions. Patient is not currently taking opioid prescriptions. Functional ability and status Nutritional status Physical  activity Advanced directives List of other physicians Hospitalizations, surgeries, and ER visits in previous 12 months Vitals Screenings to include cognitive, depression, and falls Referrals and appointments  In addition, I have reviewed and discussed with patient certain preventive protocols, quality metrics, and best practice recommendations. A written personalized care plan for preventive services as well as general preventive health recommendations were provided to patient.     Daphane Shepherd, LPN   07/25/7216   Nurse Notes: none

## 2022-01-30 NOTE — Patient Instructions (Signed)
Sheila Ferrell , Thank you for taking time to come for your Medicare Wellness Visit. I appreciate your ongoing commitment to your health goals. Please review the following plan we discussed and let me know if I can assist you in the future.   These are the goals we discussed:  Goals       Chronic Disease Management Needs      CARE PLAN ENTRY (see longtitudinal plan of care for additional care plan information)  Current Barriers:  Chronic Disease Management support, education, and care coordination needs related to Mixed hyperlipidemia, Hypothyroidism,GAD, Vitamin D deficiency, major depressive disorder, hypertension  Clinical Goal(s) related to Mixed hyperlipidemia, Hypothyroidism,GAD, Vitamin D deficiency, major depressive disorder, hypertension:  Over the next 60 days, patient will:  Work with the care management team to address educational, disease management, and care coordination needs  Begin or continue self health monitoring activities as directed today Measure and record blood pressure 5 times per week Call provider office for new or worsened signs and symptoms Blood pressure findings outside established parameters and New or worsened symptom related to chronic medical conditions Call care management team with questions or concerns Verbalize basic understanding of patient centered plan of care established today  Interventions related to Mixed hyperlipidemia, Hypothyroidism,GAD, Vitamin D deficiency, major depressive disorder, hypertension:  Evaluation of current treatment plans and patient's adherence to plan as established by provider Assessed patient understanding of disease states Assessed patient's education and care coordination needs Provided disease specific education to patient  Collaborated with appropriate clinical care team members regarding patient needs  Patient Self Care Activities related to Mixed hyperlipidemia, Hypothyroidism,GAD, Vitamin D deficiency, major  depressive disorder, hypertension:  Patient is unable to independently self-manage chronic health conditions  Initial goal documentation       Client states she would like to talk with someone about her feelings of sadness and depression (pt-stated)      Current Barriers:  Mental Health Challenges Managing Depression symptoms is challenging  Clinical Social Work Clinical Goal(s):  Over the next 30 days, client will work with LCSW to address concerns related to depression of client and management of depression symptoms  Interventions: Provided patient with information about CCM program services.   LCSW talked with client about health needs of her spouse (spouse has Dementia, Parkinson's Disease) Talked with client about relaxation techniques of choice (used to enjoy traveling,) Talked with client about sleeping challenges for client.  Talked with client about fall potential of her spouse Talked with client about deep breathing exercises Talked with client about muscles relaxation technique and creative imagery relaxation technique  Patient Self Care Activities:  Self administers medications as prescribed Attends all scheduled provider appointments  Performs ADL's independently    Plan:    Client to attend scheduled medical appointments [place] to use relaxation techniques to help her manage symptoms faced. 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  Nurse Case Manager Clinical Goal(s):  Over the  next 60 days, patient will work with RN Care Manager to address needs related to weight management and need for increased physical activity  Interventions:  Chart reviewed Talked with patient by telephone She would like to increase her physical activity level but is currently unable to do very much because most of her attention goes to taking care of her disabled husband She does have [place] services that are supposed to start Monday and should be able to free up some of her time Discussed increased physical activity and exercise Discussed weight increase and management Discussed hypothyroidism. Reviewed medications and recent lab results.  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 Patient will be scheduled for outreach.  Provided with CCM contact information and encouraged to reach out as needed  Patient Self Care Activities:  Performs ADL's independently Performs IADL's independently   Initial  goal documentation         This is a list of the screening recommended for you and due dates:  Health Maintenance  Topic Date Due   Mammogram  12/13/2020   COVID-19 Vaccine (3 - 2023-24 season) 09/06/2021   Medicare Annual Wellness Visit  01/31/2023   DTaP/Tdap/Td vaccine (2 - Td or Tdap) 04/12/2023   Pneumonia Vaccine  Completed   Flu Shot  Completed   DEXA scan (bone density measurement)  Completed   Zoster (Shingles) Vaccine  Completed   HPV Vaccine  Aged Out    Advanced directives: Please bring a copy of your health care power of attorney and living will to the office to be added to your chart at your convenience.   Conditions/risks identified: Aim for 30 minutes of exercise or brisk walking, 6-8 glasses of water, and 5 servings of fruits and vegetables each day.   Next appointment: Follow up in one year for your annual wellness visit    Preventive Care 65 Years and Older, Female Preventive care refers to lifestyle choices and visits with your health care provider that can promote health and wellness. What does preventive care include? A yearly physical exam. This is also called an annual well check. Dental exams once or twice a year. Routine eye exams. Ask your health care provider how often you should have your eyes checked. Personal lifestyle choices, including: Daily care of your teeth and gums. Regular physical activity. Eating a healthy diet. Avoiding tobacco and drug use. Limiting alcohol use. Practicing safe sex. Taking low-dose aspirin every day. Taking vitamin and mineral supplements as recommended by  your health care provider. What happens during an annual well check? The services and screenings done by your health care provider during your annual well check will depend on your age, overall health, lifestyle risk factors, and family history of disease. Counseling  Your health care provider may ask you questions about your: Alcohol use. Tobacco use. Drug  use. Emotional well-being. Home and relationship well-being. Sexual activity. Eating habits. History of falls. Memory and ability to understand (cognition). Work and work Statistician. Reproductive health. Screening  You may have the following tests or measurements: Height, weight, and BMI. Blood pressure. Lipid and cholesterol levels. These may be checked every 5 years, or more frequently if you are over 60 years old. Skin check. Lung cancer screening. You may have this screening every year starting at age 61 if you have a 30-pack-year history of smoking and currently smoke or have quit within the past 15 years. Fecal occult blood test (FOBT) of the stool. You may have this test every year starting at age 33. Flexible sigmoidoscopy or colonoscopy. You may have a sigmoidoscopy every 5 years or a colonoscopy every 10 years starting at age 35. Hepatitis C blood test. Hepatitis B blood test. Sexually transmitted disease (STD) testing. Diabetes screening. This is done by checking your blood sugar (glucose) after you have not eaten for a while (fasting). You may have this done every 1-3 years. Bone density scan. This is done to screen for osteoporosis. You may have this done starting at age 95. Mammogram. This may be done every 1-2 years. Talk to your health care provider about how often you should have regular mammograms. Talk with your health care provider about your test results, treatment options, and if necessary, the need for more tests. Vaccines  Your health care provider may recommend certain vaccines, such as: Influenza vaccine. This is recommended every year. Tetanus, diphtheria, and acellular pertussis (Tdap, Td) vaccine. You may need a Td booster every 10 years. Zoster vaccine. You may need this after age 36. Pneumococcal 13-valent conjugate (PCV13) vaccine. One dose is recommended after age 78. Pneumococcal polysaccharide (PPSV23) vaccine. One dose is recommended after age  8. Talk to your health care provider about which screenings and vaccines you need and how often you need them. This information is not intended to replace advice given to you by your health care provider. Make sure you discuss any questions you have with your health care provider. Document Released: 01/19/2015 Document Revised: 09/12/2015 Document Reviewed: 10/24/2014 Elsevier Interactive Patient Education  2017 [place] Prevention in the Home Falls can cause injuries. They can happen to people of all ages. There are many things you can do to make your home safe and to help prevent falls. What can I do on the outside of my home? Regularly fix the edges of walkways and driveways and fix any cracks. Remove anything that might make you trip as you walk through a door, such as a raised step or threshold. Trim any bushes or trees on the path to your home. Use bright outdoor lighting. Clear any walking paths of anything that might make someone trip, such as rocks or tools. Regularly check to see if handrails are loose or broken. Make sure that both sides of any steps have handrails. Any raised decks and porches should have guardrails on the edges. Have any leaves, snow, or ice cleared regularly. Use sand or salt on walking paths during winter. Clean up any spills in your garage right away. This  includes oil or grease spills. What can I do in the bathroom? Use night lights. Install grab bars by the toilet and in the tub and shower. Do not use towel bars as grab bars. Use non-skid mats or decals in the tub or shower. If you need to sit down in the shower, use a plastic, non-slip stool. Keep the floor dry. Clean up any water that spills on the floor as soon as it happens. Remove soap buildup in the tub or shower regularly. Attach bath mats securely with double-sided non-slip rug tape. Do not have throw rugs and other things on the floor that can make you trip. What can I do in the  bedroom? Use night lights. Make sure that you have a light by your bed that is easy to reach. Do not use any sheets or blankets that are too big for your bed. They should not hang down onto the floor. Have a firm chair that has side arms. You can use this for support while you get dressed. Do not have throw rugs and other things on the floor that can make you trip. What can I do in the kitchen? Clean up any spills right away. Avoid walking on wet floors. Keep items that you use a lot in easy-to-reach places. If you need to reach something above you, use a strong step stool that has a grab bar. Keep electrical cords out of the way. Do not use floor polish or wax that makes floors slippery. If you must use wax, use non-skid floor wax. Do not have throw rugs and other things on the floor that can make you trip. What can I do with my stairs? Do not leave any items on the stairs. Make sure that there are handrails on both sides of the stairs and use them. Fix handrails that are broken or loose. Make sure that handrails are as long as the stairways. Check any carpeting to make sure that it is firmly attached to the stairs. Fix any carpet that is loose or worn. Avoid having throw rugs at the top or bottom of the stairs. If you do have throw rugs, attach them to the floor with carpet tape. Make sure that you have a light switch at the top of the stairs and the bottom of the stairs. If you do not have them, ask someone to add them for you. What else can I do to help prevent falls? Wear shoes that: Do not have high heels. Have rubber bottoms. Are comfortable and fit you well. Are closed at the toe. Do not wear sandals. If you use a stepladder: Make sure that it is fully opened. Do not climb a closed stepladder. Make sure that both sides of the stepladder are locked into place. Ask someone to hold it for you, if possible. Clearly mark and make sure that you can see: Any grab bars or  handrails. First and last steps. Where the edge of each step is. Use tools that help you move around (mobility aids) if they are needed. These include: Canes. Walkers. Scooters. Crutches. Turn on the lights when you go into a dark area. Replace any light bulbs as soon as they burn out. Set up your furniture so you have a clear path. Avoid moving your furniture around. If any of your floors are uneven, fix them. If there are any pets around you, be aware of where they are. Review your medicines with your doctor. Some medicines can make you feel dizzy.  This can increase your chance of falling. Ask your doctor what other things that you can do to help prevent falls. This information is not intended to replace advice given to you by your health care provider. Make sure you discuss any questions you have with your health care provider. Document Released: 10/19/2008 Document Revised: 05/31/2015 Document Reviewed: 01/27/2014 Elsevier Interactive Patient Education  2017 [employer].

## 2022-04-20 DIAGNOSIS — M79672 Pain in left foot: Secondary | ICD-10-CM | POA: Diagnosis not present

## 2022-04-20 DIAGNOSIS — M7752 Other enthesopathy of left foot: Secondary | ICD-10-CM | POA: Diagnosis not present

## 2022-05-26 DIAGNOSIS — I10 Essential (primary) hypertension: Secondary | ICD-10-CM | POA: Diagnosis not present

## 2022-05-26 DIAGNOSIS — R194 Change in bowel habit: Secondary | ICD-10-CM | POA: Diagnosis not present

## 2022-05-26 DIAGNOSIS — R0602 Shortness of breath: Secondary | ICD-10-CM | POA: Diagnosis not present

## 2022-05-26 DIAGNOSIS — R1084 Generalized abdominal pain: Secondary | ICD-10-CM | POA: Diagnosis not present

## 2022-05-26 DIAGNOSIS — R5383 Other fatigue: Secondary | ICD-10-CM | POA: Diagnosis not present

## 2022-05-30 DIAGNOSIS — K5730 Diverticulosis of large intestine without perforation or abscess without bleeding: Secondary | ICD-10-CM | POA: Diagnosis not present

## 2022-05-30 DIAGNOSIS — R1084 Generalized abdominal pain: Secondary | ICD-10-CM | POA: Diagnosis not present

## 2022-05-30 DIAGNOSIS — K8020 Calculus of gallbladder without cholecystitis without obstruction: Secondary | ICD-10-CM | POA: Diagnosis not present

## 2022-05-30 DIAGNOSIS — K760 Fatty (change of) liver, not elsewhere classified: Secondary | ICD-10-CM | POA: Diagnosis not present

## 2022-05-30 DIAGNOSIS — K429 Umbilical hernia without obstruction or gangrene: Secondary | ICD-10-CM | POA: Diagnosis not present

## 2022-05-30 DIAGNOSIS — R194 Change in bowel habit: Secondary | ICD-10-CM | POA: Diagnosis not present

## 2022-06-18 DIAGNOSIS — K7689 Other specified diseases of liver: Secondary | ICD-10-CM | POA: Diagnosis not present

## 2022-06-18 DIAGNOSIS — K869 Disease of pancreas, unspecified: Secondary | ICD-10-CM | POA: Diagnosis not present

## 2022-06-18 DIAGNOSIS — K760 Fatty (change of) liver, not elsewhere classified: Secondary | ICD-10-CM | POA: Diagnosis not present

## 2022-06-18 DIAGNOSIS — K862 Cyst of pancreas: Secondary | ICD-10-CM | POA: Diagnosis not present

## 2022-06-18 DIAGNOSIS — K8020 Calculus of gallbladder without cholecystitis without obstruction: Secondary | ICD-10-CM | POA: Diagnosis not present

## 2022-06-26 ENCOUNTER — Ambulatory Visit: Payer: Medicare PPO | Admitting: Nurse Practitioner

## 2022-07-23 DIAGNOSIS — K9289 Other specified diseases of the digestive system: Secondary | ICD-10-CM | POA: Diagnosis not present

## 2022-07-23 DIAGNOSIS — R932 Abnormal findings on diagnostic imaging of liver and biliary tract: Secondary | ICD-10-CM | POA: Diagnosis not present

## 2022-07-23 DIAGNOSIS — R1310 Dysphagia, unspecified: Secondary | ICD-10-CM | POA: Diagnosis not present

## 2022-07-23 DIAGNOSIS — K219 Gastro-esophageal reflux disease without esophagitis: Secondary | ICD-10-CM | POA: Diagnosis not present

## 2022-07-25 DIAGNOSIS — N8500 Endometrial hyperplasia, unspecified: Secondary | ICD-10-CM | POA: Diagnosis not present

## 2022-07-29 DIAGNOSIS — K224 Dyskinesia of esophagus: Secondary | ICD-10-CM | POA: Diagnosis not present

## 2022-07-29 DIAGNOSIS — R1310 Dysphagia, unspecified: Secondary | ICD-10-CM | POA: Diagnosis not present

## 2022-08-05 DIAGNOSIS — K859 Acute pancreatitis without necrosis or infection, unspecified: Secondary | ICD-10-CM | POA: Diagnosis not present

## 2022-08-05 DIAGNOSIS — D1809 Hemangioma of other sites: Secondary | ICD-10-CM | POA: Diagnosis not present

## 2022-08-05 DIAGNOSIS — J9691 Respiratory failure, unspecified with hypoxia: Secondary | ICD-10-CM | POA: Diagnosis not present

## 2022-08-05 DIAGNOSIS — K769 Liver disease, unspecified: Secondary | ICD-10-CM | POA: Diagnosis not present

## 2022-08-05 DIAGNOSIS — G9341 Metabolic encephalopathy: Secondary | ICD-10-CM | POA: Diagnosis not present

## 2022-08-22 DIAGNOSIS — K219 Gastro-esophageal reflux disease without esophagitis: Secondary | ICD-10-CM | POA: Diagnosis not present

## 2022-08-22 DIAGNOSIS — R932 Abnormal findings on diagnostic imaging of liver and biliary tract: Secondary | ICD-10-CM | POA: Diagnosis not present

## 2022-08-22 DIAGNOSIS — R1310 Dysphagia, unspecified: Secondary | ICD-10-CM | POA: Diagnosis not present

## 2022-09-02 DIAGNOSIS — E039 Hypothyroidism, unspecified: Secondary | ICD-10-CM | POA: Diagnosis not present

## 2022-09-02 DIAGNOSIS — R809 Proteinuria, unspecified: Secondary | ICD-10-CM | POA: Diagnosis not present

## 2022-09-02 DIAGNOSIS — E559 Vitamin D deficiency, unspecified: Secondary | ICD-10-CM | POA: Diagnosis not present

## 2022-09-02 DIAGNOSIS — K760 Fatty (change of) liver, not elsewhere classified: Secondary | ICD-10-CM | POA: Diagnosis not present

## 2022-09-02 DIAGNOSIS — E785 Hyperlipidemia, unspecified: Secondary | ICD-10-CM | POA: Diagnosis not present

## 2022-09-02 DIAGNOSIS — I10 Essential (primary) hypertension: Secondary | ICD-10-CM | POA: Diagnosis not present

## 2022-09-02 DIAGNOSIS — N281 Cyst of kidney, acquired: Secondary | ICD-10-CM | POA: Diagnosis not present

## 2022-09-09 DIAGNOSIS — N8500 Endometrial hyperplasia, unspecified: Secondary | ICD-10-CM | POA: Diagnosis not present

## 2022-09-09 DIAGNOSIS — R9389 Abnormal findings on diagnostic imaging of other specified body structures: Secondary | ICD-10-CM | POA: Diagnosis not present

## 2022-09-09 DIAGNOSIS — N950 Postmenopausal bleeding: Secondary | ICD-10-CM | POA: Diagnosis not present

## 2022-09-09 DIAGNOSIS — Z1330 Encounter for screening examination for mental health and behavioral disorders, unspecified: Secondary | ICD-10-CM | POA: Diagnosis not present

## 2022-09-12 DIAGNOSIS — R9389 Abnormal findings on diagnostic imaging of other specified body structures: Secondary | ICD-10-CM | POA: Diagnosis not present

## 2022-09-12 DIAGNOSIS — N950 Postmenopausal bleeding: Secondary | ICD-10-CM | POA: Diagnosis not present

## 2022-09-15 DIAGNOSIS — M1990 Unspecified osteoarthritis, unspecified site: Secondary | ICD-10-CM | POA: Diagnosis not present

## 2022-09-15 DIAGNOSIS — F419 Anxiety disorder, unspecified: Secondary | ICD-10-CM | POA: Diagnosis not present

## 2022-09-15 DIAGNOSIS — N840 Polyp of corpus uteri: Secondary | ICD-10-CM | POA: Diagnosis not present

## 2022-09-15 DIAGNOSIS — E039 Hypothyroidism, unspecified: Secondary | ICD-10-CM | POA: Diagnosis not present

## 2022-09-15 DIAGNOSIS — K219 Gastro-esophageal reflux disease without esophagitis: Secondary | ICD-10-CM | POA: Diagnosis not present

## 2022-09-15 DIAGNOSIS — E669 Obesity, unspecified: Secondary | ICD-10-CM | POA: Diagnosis not present

## 2022-09-15 DIAGNOSIS — I10 Essential (primary) hypertension: Secondary | ICD-10-CM | POA: Diagnosis not present

## 2022-09-15 DIAGNOSIS — E785 Hyperlipidemia, unspecified: Secondary | ICD-10-CM | POA: Diagnosis not present

## 2022-09-15 DIAGNOSIS — F32A Depression, unspecified: Secondary | ICD-10-CM | POA: Diagnosis not present

## 2022-09-15 DIAGNOSIS — N950 Postmenopausal bleeding: Secondary | ICD-10-CM | POA: Diagnosis not present

## 2022-09-18 ENCOUNTER — Other Ambulatory Visit: Payer: Self-pay | Admitting: *Deleted

## 2022-09-18 NOTE — Telephone Encounter (Signed)
Fax request from [facility] pharmacy Request for [employer] on fax & sending back - Not on pt's med list or her history - denied.

## 2022-09-23 DIAGNOSIS — N8501 Benign endometrial hyperplasia: Secondary | ICD-10-CM | POA: Diagnosis not present

## 2022-10-09 DIAGNOSIS — R609 Edema, unspecified: Secondary | ICD-10-CM | POA: Diagnosis not present

## 2022-10-09 DIAGNOSIS — E039 Hypothyroidism, unspecified: Secondary | ICD-10-CM | POA: Diagnosis not present

## 2022-10-09 DIAGNOSIS — I10 Essential (primary) hypertension: Secondary | ICD-10-CM | POA: Diagnosis not present

## 2022-10-09 DIAGNOSIS — E785 Hyperlipidemia, unspecified: Secondary | ICD-10-CM | POA: Diagnosis not present

## 2022-10-30 DIAGNOSIS — M1712 Unilateral primary osteoarthritis, left knee: Secondary | ICD-10-CM | POA: Diagnosis not present

## 2022-10-30 DIAGNOSIS — M25562 Pain in left knee: Secondary | ICD-10-CM | POA: Diagnosis not present

## 2022-12-16 DIAGNOSIS — N8501 Benign endometrial hyperplasia: Secondary | ICD-10-CM | POA: Diagnosis not present

## 2022-12-16 DIAGNOSIS — N3946 Mixed incontinence: Secondary | ICD-10-CM | POA: Diagnosis not present

## 2022-12-16 DIAGNOSIS — N858 Other specified noninflammatory disorders of uterus: Secondary | ICD-10-CM | POA: Diagnosis not present

## 2023-01-08 DIAGNOSIS — R251 Tremor, unspecified: Secondary | ICD-10-CM | POA: Diagnosis not present

## 2023-01-08 DIAGNOSIS — E039 Hypothyroidism, unspecified: Secondary | ICD-10-CM | POA: Diagnosis not present

## 2023-01-08 DIAGNOSIS — E785 Hyperlipidemia, unspecified: Secondary | ICD-10-CM | POA: Diagnosis not present

## 2023-01-08 DIAGNOSIS — I10 Essential (primary) hypertension: Secondary | ICD-10-CM | POA: Diagnosis not present

## 2023-02-02 DIAGNOSIS — G250 Essential tremor: Secondary | ICD-10-CM | POA: Diagnosis not present
